# Patient Record
Sex: Male | Born: 1945 | Race: White | Hispanic: No | Marital: Married | Smoking: Former smoker
Health system: Southern US, Community
[De-identification: ages and names within clinical notes are randomized; demographics above are authoritative.]

## PROBLEM LIST (undated history)

## (undated) DIAGNOSIS — I4891 Unspecified atrial fibrillation: Secondary | ICD-10-CM

## (undated) DIAGNOSIS — I10 Essential (primary) hypertension: Secondary | ICD-10-CM

## (undated) DIAGNOSIS — A809 Acute poliomyelitis, unspecified: Secondary | ICD-10-CM

## (undated) DIAGNOSIS — J449 Chronic obstructive pulmonary disease, unspecified: Secondary | ICD-10-CM

## (undated) DIAGNOSIS — E7800 Pure hypercholesterolemia, unspecified: Secondary | ICD-10-CM

## (undated) HISTORY — DX: Unspecified atrial fibrillation: I48.91

## (undated) HISTORY — DX: Acute poliomyelitis, unspecified: A80.9

## (undated) HISTORY — PX: OTHER SURGICAL HISTORY: SHX169

---

## 2021-07-28 ENCOUNTER — Emergency Department (HOSPITAL_COMMUNITY): Payer: Medicare Other

## 2021-07-28 ENCOUNTER — Emergency Department (HOSPITAL_COMMUNITY)
Admission: EM | Admit: 2021-07-28 | Discharge: 2021-07-29 | Disposition: A | Discharge status: Home / Self Care | Payer: Medicare Other | Attending: Emergency Medicine | Admitting: Emergency Medicine

## 2021-07-28 ENCOUNTER — Encounter (HOSPITAL_COMMUNITY): Payer: Self-pay | Admitting: *Deleted

## 2021-07-28 ENCOUNTER — Other Ambulatory Visit: Payer: Self-pay

## 2021-07-28 DIAGNOSIS — Z96659 Presence of unspecified artificial knee joint: Secondary | ICD-10-CM | POA: Insufficient documentation

## 2021-07-28 DIAGNOSIS — R0602 Shortness of breath: Secondary | ICD-10-CM | POA: Diagnosis not present

## 2021-07-28 DIAGNOSIS — I10 Essential (primary) hypertension: Secondary | ICD-10-CM | POA: Insufficient documentation

## 2021-07-28 DIAGNOSIS — R0600 Dyspnea, unspecified: Secondary | ICD-10-CM

## 2021-07-28 DIAGNOSIS — R600 Localized edema: Secondary | ICD-10-CM | POA: Diagnosis not present

## 2021-07-28 DIAGNOSIS — J449 Chronic obstructive pulmonary disease, unspecified: Secondary | ICD-10-CM | POA: Diagnosis not present

## 2021-07-28 DIAGNOSIS — Z7951 Long term (current) use of inhaled steroids: Secondary | ICD-10-CM | POA: Diagnosis not present

## 2021-07-28 DIAGNOSIS — Z79899 Other long term (current) drug therapy: Secondary | ICD-10-CM | POA: Diagnosis not present

## 2021-07-28 DIAGNOSIS — Z87891 Personal history of nicotine dependence: Secondary | ICD-10-CM | POA: Insufficient documentation

## 2021-07-28 DIAGNOSIS — Z20822 Contact with and (suspected) exposure to covid-19: Secondary | ICD-10-CM | POA: Diagnosis not present

## 2021-07-28 DIAGNOSIS — R609 Edema, unspecified: Secondary | ICD-10-CM

## 2021-07-28 DIAGNOSIS — Z7901 Long term (current) use of anticoagulants: Secondary | ICD-10-CM | POA: Diagnosis not present

## 2021-07-28 HISTORY — DX: Pure hypercholesterolemia, unspecified: E78.00

## 2021-07-28 HISTORY — DX: Chronic obstructive pulmonary disease, unspecified: J44.9

## 2021-07-28 HISTORY — DX: Essential (primary) hypertension: I10

## 2021-07-28 LAB — CBC WITH DIFFERENTIAL/PLATELET
Abs Immature Granulocytes: 0.04 10*3/uL (ref 0.00–0.07)
Basophils Absolute: 0 10*3/uL (ref 0.0–0.1)
Basophils Relative: 0 %
Eosinophils Absolute: 0.2 10*3/uL (ref 0.0–0.5)
Eosinophils Relative: 2 %
HCT: 41.5 % (ref 39.0–52.0)
Hemoglobin: 13.2 g/dL (ref 13.0–17.0)
Immature Granulocytes: 1 %
Lymphocytes Relative: 11 %
Lymphs Abs: 0.9 10*3/uL (ref 0.7–4.0)
MCH: 29.5 pg (ref 26.0–34.0)
MCHC: 31.8 g/dL (ref 30.0–36.0)
MCV: 92.8 fL (ref 80.0–100.0)
Monocytes Absolute: 0.6 10*3/uL (ref 0.1–1.0)
Monocytes Relative: 7 %
Neutro Abs: 6.4 10*3/uL (ref 1.7–7.7)
Neutrophils Relative %: 79 %
Platelets: 175 10*3/uL (ref 150–400)
RBC: 4.47 MIL/uL (ref 4.22–5.81)
RDW: 16.4 % — ABNORMAL HIGH (ref 11.5–15.5)
WBC: 8 10*3/uL (ref 4.0–10.5)
nRBC: 0 % (ref 0.0–0.2)

## 2021-07-28 LAB — BASIC METABOLIC PANEL
Anion gap: 6 (ref 5–15)
BUN: 15 mg/dL (ref 8–23)
CO2: 26 mmol/L (ref 22–32)
Calcium: 9.4 mg/dL (ref 8.9–10.3)
Chloride: 105 mmol/L (ref 98–111)
Creatinine, Ser: 0.74 mg/dL (ref 0.61–1.24)
GFR, Estimated: >60 mL/min (ref >60)
Glucose, Bld: 69 mg/dL — ABNORMAL LOW (ref 70–99)
Potassium: 4.2 mmol/L (ref 3.5–5.1)
Sodium: 137 mmol/L (ref 135–145)

## 2021-07-28 LAB — TROPONIN I (HIGH SENSITIVITY)
Troponin I (High Sensitivity): 8 ng/L (ref <18)
Troponin I (High Sensitivity): 8 ng/L (ref <18)

## 2021-07-28 LAB — RESP PANEL BY RT-PCR (FLU A&B, COVID) ARPGX2
Influenza A by PCR: NEGATIVE
Influenza B by PCR: NEGATIVE
SARS Coronavirus 2 by RT PCR: NEGATIVE

## 2021-07-28 LAB — BRAIN NATRIURETIC PEPTIDE: B Natriuretic Peptide: 322 pg/mL — ABNORMAL HIGH (ref 0.0–100.0)

## 2021-07-28 MED ORDER — FUROSEMIDE 20 MG PO TABS: 20.0000 mg | ORAL_TABLET | Freq: Every day | ORAL | 0 refills | Status: DC

## 2021-07-28 NOTE — Discharge Instructions (Signed)
Take the diuretic to see if that helps with the swelling in your breathing.  Follow-up with your doctor to be rechecked.  Return to the ED as needed for recurrent or worsening symptoms

## 2021-07-28 NOTE — ED Provider Notes (Signed)
[facility] EMERGENCY DEPARTMENT Provider Note   CSN: [id number] Arrival date & time: 07/28/21  1512     History Chief Complaint  Patient presents with   Shortness of Breath    Noelle Hoogland is a 75 y.o. male.   Shortness of Breath  Patient presented to the ED for evaluation of shortness of breath.  Patient states he has had the symptoms for the last couple of weeks.  He has noticed that when he exerts himself he starts to get more short of breath.  He has not had any fevers or chills.  He has not had any chest pain.  He did have a change in his medications about a week ago.  He was on a combination blood pressure medication that included a diuretic.  He was taken off the diuretic portion prior to the symptoms starting.  This morning his daughter noticed that his tongue seemed dry and his speech seemed a little bit off.  She convinced him to come to the ED.  Patient states he was not feeling any tickly worse today.  He is not having any trouble with his speech now.  He denies any focal numbness or weakness.  No headache.  Past Medical History:  Diagnosis Date   COPD (chronic obstructive pulmonary disease) (HCC)    High cholesterol    Hypertension     There are no problems to display for this patient.   Past Surgical History:  Procedure Laterality Date   right knee replacement     rotator cuff surgery Left        History reviewed. No pertinent family history.  Social History   Tobacco Use   Smoking status: Former    Types: Cigarettes   Smokeless tobacco: Never  Substance Use Topics   Alcohol use: Yes    Comment: occ. use   Drug use: Never    Home Medications Prior to Admission medications   Medication Sig Start Date End Date Taking? Authorizing Provider  albuterol (VENTOLIN HFA) 108 (90 Base) MCG/ACT inhaler Inhale into the lungs.   Yes [provider]  Cholecalciferol 50 MCG (2000 UT) CAPS Take 1 capsule by mouth daily.   Yes [provider]  citalopram (CELEXA) 20 MG tablet Take 20 mg by mouth daily. 04/24/12  Yes [provider]  docusate sodium (COLACE) 100 MG capsule Take 100 mg by mouth daily.   Yes [provider]  fluticasone (FLONASE) 50 MCG/ACT nasal spray Place 2 sprays into both nostrils daily as needed for allergies. 05/02/12  Yes [provider]  furosemide (LASIX) 20 MG tablet Take 1 tablet (20 mg total) by mouth daily for 5 days. 07/28/21 08/02/21 Yes Linwood Dibbles, MD  glimepiride (AMARYL) 2 MG tablet Take 2 mg by mouth daily with breakfast.   Yes [provider]  losartan (COZAAR) 100 MG tablet Take 100 mg by mouth daily. 07/07/21  Yes [provider]  metoprolol tartrate (LOPRESSOR) 100 MG tablet Take 100 mg by mouth 2 (two) times daily.   Yes [provider]  Misc Natural Products (NEURIVA PO) Take 1 tablet by mouth daily.   Yes [provider]  mupirocin ointment (BACTROBAN) 2 % Apply topically daily. 03/26/21  Yes [provider]  oxybutynin (DITROPAN-XL) 5 MG 24 hr tablet Take 5 mg by mouth at bedtime.   Yes [provider]  pantoprazole (PROTONIX) 40 MG tablet Take 40 mg by mouth daily. 08/06/14  Yes [provider]  rivaroxaban (  XARELTO) 20 MG TABS tablet Take 20 mg by mouth daily.   Yes [provider]  rosuvastatin (CRESTOR) 10 MG tablet Take 10 mg by mouth daily.   Yes [provider]  SYMBICORT 160-4.5 MCG/ACT inhaler Inhale 2 puffs into the lungs 2 (two) times daily. 02/26/21  Yes [provider]  levofloxacin (LEVAQUIN) 500 MG tablet levofloxacin 500 mg tablet  Take 1 tablet every day by oral route. Patient not taking: Reported on 07/28/2021    [provider]    Allergies    Patient has no known allergies.  Review of Systems   Review of Systems  Respiratory:  Positive for shortness of breath.   All other systems reviewed and are negative.  Physical Exam Updated  Vital Signs BP 137/82   Pulse (!) 29   Temp 98.2 F (36.8 C) (Oral)   Resp (!) 22   Ht 1.702 m (5\' 7" )   Wt 106.6 kg   SpO2 97%   BMI 36.81 kg/m   Physical Exam Vitals and nursing note reviewed.  Constitutional:      General: He is not in acute distress. HENT:     Head: Normocephalic and atraumatic.     Right Ear: External ear normal.     Left Ear: External ear normal.  Eyes:     General: No scleral icterus.       Right eye: No discharge.        Left eye: No discharge.     Conjunctiva/sclera: Conjunctivae normal.  Neck:     Trachea: No tracheal deviation.  Cardiovascular:     Rate and Rhythm: Normal rate and regular rhythm.  Pulmonary:     Effort: Pulmonary effort is normal. No respiratory distress.     Breath sounds: No stridor. Rales present. No wheezing.  Abdominal:     General: Bowel sounds are normal. There is no distension.     Palpations: Abdomen is soft.     Tenderness: There is no abdominal tenderness. There is no guarding or rebound.  Musculoskeletal:        General: No tenderness or deformity.     Cervical back: Neck supple.     Right lower leg: Edema present.     Left lower leg: Edema present.     Comments: Atrophy left lower extremity, patient is status post polio  Skin:    General: Skin is warm and dry.     Findings: No rash.  Neurological:     General: No focal deficit present.     Mental Status: He is alert.     Cranial Nerves: No cranial nerve deficit (no facial droop, extraocular movements intact, no slurred speech).     Sensory: No sensory deficit.     Motor: No abnormal muscle tone or seizure activity.     Coordination: Coordination normal.     Comments: Weakness left lower extremity  Psychiatric:        Mood and Affect: Mood normal.    ED Results / Procedures / Treatments   Labs (all labs ordered are listed, but only abnormal results are displayed) Labs Reviewed  CBC WITH DIFFERENTIAL/PLATELET - Abnormal; Notable for the following  components:      Result Value   RDW 16.4 (*)    All other components within normal limits  BASIC METABOLIC PANEL - Abnormal; Notable for the following components:   Glucose, Bld 69 (*)    All other components within normal limits  BRAIN NATRIURETIC PEPTIDE -  Abnormal; Notable for the following components:   B Natriuretic Peptide 322.0 (*)    All other components within normal limits  RESP PANEL BY RT-PCR (FLU A&B, COVID) ARPGX2  TROPONIN I (HIGH SENSITIVITY)  TROPONIN I (HIGH SENSITIVITY)    EKG None  Radiology DG Chest 2 View  Result Date: 07/28/2021 CLINICAL DATA:  Shortness of breath. EXAM: CHEST - 2 VIEW COMPARISON:  None. FINDINGS: The heart is upper limits of normal in size given the AP projection. The mediastinal and hilar contours are within normal limits. There is mild central vascular congestion but no overt pulmonary edema. Small bilateral pleural effusions and bibasilar atelectasis noted. No worrisome pulmonary lesions. The bony thorax is intact. IMPRESSION: Vascular congestion, small bilateral pleural effusions and bibasilar atelectasis. Electronically Signed   By: Rudie Meyer M.D.   On: 07/28/2021 16:39    Procedures Procedures   Medications Ordered in ED Medications - No data to display  ED Course  I have reviewed the triage vital signs and the nursing notes.  Pertinent labs & imaging results that were available during my care of the patient were reviewed by me and considered in my medical decision making (see chart for details).  Clinical Course as of 07/28/21 2231  Tue Jul 28, 2021  2130 BNP is slightly increased at 322.  Troponin is normal.  COVID and flu are negative [JK]  2130 Chest x-ray shows vascular congestion [JK]    Clinical Course User Index [JK] Linwood Dibbles, MD   MDM Rules/Calculators/A&P                           Patient presented to the ER for evaluation of shortness of breath.  Patient's symptoms have been ongoing for couple weeks.   Patient did not feel that he gotten much worse but his daughter was concerned and wanted him to get checked out.  Patient does not have any pneumonia on x-ray.  Chest x-ray does show some vascular congestion.  BNP is slightly elevated.  No findings to suggest cardiac ischemia.  COVID and flu are negative.  Troponin is normal.  Patient has been on anticholinergic medications that could be affecting the dry mouth sensation.  He stopped taking his diuretic medication 1 week ago and this could be contributing to his symptoms.  We will have him try a short course of Lasix considering the peripheral edema and the vascular congestion.  We will have him closely follow-up with his primary care doctor to be rechecked.  At this time I do not feel that patient requires hospitalization he is comfortable with this plan. Final Clinical Impression(s) / ED Diagnoses Final diagnoses:  Dyspnea, unspecified type  Peripheral edema    Rx / DC Orders ED Discharge Orders          Ordered    furosemide (LASIX) 20 MG tablet  Daily        07/28/21 2229             Linwood Dibbles, MD 07/28/21 2231

## 2021-07-28 NOTE — ED Triage Notes (Addendum)
Pt with SOB with exertion for a week and gotten worse.  Denies SOB at rest.  Mouth with sores and tongue dry and thick.  Daughter in triage states pt with yeast under his armpits as well.

## 2021-11-06 ENCOUNTER — Other Ambulatory Visit (INDEPENDENT_AMBULATORY_CARE_PROVIDER_SITE_OTHER): Payer: Medicare Other

## 2021-11-06 ENCOUNTER — Ambulatory Visit (INDEPENDENT_AMBULATORY_CARE_PROVIDER_SITE_OTHER): Payer: Medicare Other | Admitting: Physician Assistant

## 2021-11-06 ENCOUNTER — Encounter: Payer: Self-pay | Admitting: Physician Assistant

## 2021-11-06 VITALS — BP 90/60 | HR 84 | Ht 67.0 in | Wt 217.4 lb

## 2021-11-06 DIAGNOSIS — R7989 Other specified abnormal findings of blood chemistry: Secondary | ICD-10-CM

## 2021-11-06 DIAGNOSIS — E119 Type 2 diabetes mellitus without complications: Secondary | ICD-10-CM | POA: Insufficient documentation

## 2021-11-06 DIAGNOSIS — R5383 Other fatigue: Secondary | ICD-10-CM

## 2021-11-06 DIAGNOSIS — K7460 Unspecified cirrhosis of liver: Secondary | ICD-10-CM

## 2021-11-06 DIAGNOSIS — R634 Abnormal weight loss: Secondary | ICD-10-CM | POA: Diagnosis not present

## 2021-11-06 DIAGNOSIS — I10 Essential (primary) hypertension: Secondary | ICD-10-CM

## 2021-11-06 DIAGNOSIS — J449 Chronic obstructive pulmonary disease, unspecified: Secondary | ICD-10-CM | POA: Insufficient documentation

## 2021-11-06 DIAGNOSIS — G4733 Obstructive sleep apnea (adult) (pediatric): Secondary | ICD-10-CM

## 2021-11-06 DIAGNOSIS — Z8612 Personal history of poliomyelitis: Secondary | ICD-10-CM

## 2021-11-06 DIAGNOSIS — E785 Hyperlipidemia, unspecified: Secondary | ICD-10-CM | POA: Insufficient documentation

## 2021-11-06 DIAGNOSIS — I4891 Unspecified atrial fibrillation: Secondary | ICD-10-CM

## 2021-11-06 DIAGNOSIS — Z9989 Dependence on other enabling machines and devices: Secondary | ICD-10-CM

## 2021-11-06 DIAGNOSIS — D649 Anemia, unspecified: Secondary | ICD-10-CM

## 2021-11-06 DIAGNOSIS — J439 Emphysema, unspecified: Secondary | ICD-10-CM

## 2021-11-06 LAB — HEPATIC FUNCTION PANEL
ALT: 15 U/L (ref 0–53)
AST: 19 U/L (ref 0–37)
Albumin: 3.4 g/dL — ABNORMAL LOW (ref 3.5–5.2)
Alkaline Phosphatase: 96 U/L (ref 39–117)
Bilirubin, Direct: 0.2 mg/dL (ref 0.0–0.3)
Total Bilirubin: 0.7 mg/dL (ref 0.2–1.2)
Total Protein: 7.1 g/dL (ref 6.0–8.3)

## 2021-11-06 LAB — SEDIMENTATION RATE: Sed Rate: 52 mm/hr — ABNORMAL HIGH (ref 0–20)

## 2021-11-06 LAB — FERRITIN: Ferritin: 35.3 ng/mL (ref 22.0–322.0)

## 2021-11-06 LAB — PROTIME-INR
INR: 2.7 ratio — ABNORMAL HIGH (ref 0.8–1.0)
Prothrombin Time: 28 s — ABNORMAL HIGH (ref 9.6–13.1)

## 2021-11-06 NOTE — Patient Instructions (Addendum)
If you are age 77 or older, your body mass index should be between 23-30. Your Body mass index is 34.05 kg/m?Marland Kitchen If this is out of the aforementioned range listed, please consider follow up with your Primary Care Provider. ?________________________________________________________ ? ?The [facility] providers would like to encourage you to use [facility] to communicate with providers for non-urgent requests or questions.  Due to long hold times on the telephone, sending your provider a message by [facility] may be a faster and more efficient way to get a response.  Please allow 48 business hours for a response.  Please remember that this is for non-urgent requests.  ?_______________________________________________________ ? ?Your provider has requested that you go to the basement level for lab work before leaving today. Press "B" on the elevator. The lab is located at the first door on the left as you exit the elevator. ? ?You have been scheduled for an MRI at [facility] on 11/17/2021. Your appointment time is 3:00 pm. Please arrive to admitting (at main entrance of the hospital) 30 minutes prior to your appointment time for registration purposes. Please make certain not to have anything to eat or drink 6 hours prior to your test. In addition, if you have any metal in your body, have a pacemaker or defibrillator, please be sure to let your ordering physician know. This test typically takes 45 minutes to 1 hour to complete. Should you need to reschedule, please call [phone] to do so. ? ?START Miralax 1 capful daily in 8 ounces of water daily or juice. ? ?You have been scheduled to follow up with Dr Orvan Falconer on December 02, 2021 at 2:30 pm. ? ?Thank you for entrusting me with your care and choosing [facility]. ? ?Amy Esterwood, PA-C ? ?

## 2021-11-06 NOTE — Progress Notes (Signed)
Subjective:    Patient ID: Alexander Johnson, male    DOB: July 10, 1946, 76 y.o.   MRN: [id number]  HPI Alexander Johnson is a pleasant 76 year old white male, new to GI today referred by his PCP Dr. Derrel Johnson [place] for evaluation of weight loss. Patient had undergone prior colonoscopies remotely per Dr. Chandra Johnson, he believes last one was around 10 years ago, no polyps.  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  His current illness started in November 2022 with generalized fatigue and weakness.  His daughter says that she took him to the emergency room on a day where his mental status seemed altered, speech slurred and he had some drawing of his mouth.  She was afraid that he was having a CVA.  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  Daughter says that symptoms resolved within about 24 hours other than that he had some continued drawing of his mouth for some days after that.  Ever since then he has had persistent severe fatigue, and weakness and has lost about 25 pounds.  He says that he had absolutely no appetite for a couple of months and just was not eating very much.  Apparently over the past week or so his appetite has improved, he is eating better and feeling somewhat better in general. He denies any abdominal pain or discomfort, never had any nausea or vomiting, no fever or chills, no changes in bowel habits though he does have problems with mild chronic constipation.  He occasionally notes some blood on the tissue which she attributes to external hemorrhoids. No complaints of dysphagia or  odynophagia, no heartburn or indigestion.  He has had some work-up since that time with CT of the abdomen and pelvis which was done noncontrasted on 11/02/2021 this shows a nodular cirrhotic appearing liver, normal-appearing pancreas and gallbladder normal spleen, no retroperitoneal or pelvic adenopathy, no ascites, large amount of stool in the colon and diverticulosis noted.  Exam limited by lack of contrast  They have also brought some of his most recent labs-February 2023 TSH 5.0 LFTs normal with exception of an alk phos at 140 WBC 11.3/hemoglobin 14/platelets 324 CRP evaded at 11, sed rate greater than 120 Calcium 10.6  Patient is not aware of any prior diagnosis of liver disease or cirrhosis.  He had been a social drinker in the past, no family history of liver disease that they are aware of other than patient's father who also had abused alcohol.  Patient has no history of hepatitis that he is aware of.  Though feeling a bit better he has been more weak in general, comes in in a wheelchair today due to weakness.  But no specific myalgias or arthralgias other than his usual arthritic symptoms.  Review of Systems Pertinent positive and negative review of systems were noted in the above HPI section.  All other review of systems was otherwise negative.   Outpatient Encounter Medications as of 11/06/2021  Medication Sig   albuterol (VENTOLIN HFA) 108 (90 Base) MCG/ACT inhaler Inhale into the lungs.   Cholecalciferol 50 MCG (2000 UT) CAPS Take 1 capsule by mouth daily.   citalopram (CELEXA) 20 MG tablet Take  20 mg by mouth daily.   docusate sodium (COLACE) 100 MG capsule Take 100 mg by mouth daily.   fluticasone (FLONASE) 50 MCG/ACT nasal spray Place 2 sprays into both nostrils daily as needed for allergies.   glimepiride (AMARYL) 2 MG tablet Take 2 mg by mouth daily with breakfast.   levofloxacin (LEVAQUIN) 500 MG tablet levofloxacin 500 mg tablet  Take 1 tablet every day by oral route.  (Patient not taking: Reported on 07/28/2021)   losartan (COZAAR) 100 MG tablet Take 100 mg by mouth daily.   metoprolol tartrate (LOPRESSOR) 100 MG tablet Take 100 mg by mouth 2 (two) times daily.   mirabegron ER (MYRBETRIQ) 50 MG TB24 tablet Myrbetriq 50 mg tablet,extended release  Take 1 tablet every day by oral route.   Misc Natural Products (NEURIVA PO) Take 1 tablet by mouth daily.   mupirocin ointment (BACTROBAN) 2 % Apply topically daily.   pantoprazole (PROTONIX) 40 MG tablet Take 40 mg by mouth daily.   rivaroxaban (XARELTO) 20 MG TABS tablet Take 20 mg by mouth daily.   rosuvastatin (CRESTOR) 10 MG tablet Take 10 mg by mouth daily.   SYMBICORT 160-4.5 MCG/ACT inhaler Inhale 2 puffs into the lungs 2 (two) times daily.   [DISCONTINUED] furosemide (LASIX) 20 MG tablet Take 1 tablet (20 mg total) by mouth daily for 5 days.   [DISCONTINUED] oxybutynin (DITROPAN-XL) 5 MG 24 hr tablet Take 5 mg by mouth at bedtime.   No facility-administered encounter medications on file as of 11/06/2021.   Allergies  Allergen Reactions   Metformin Diarrhea   Patient Active Problem List   Diagnosis Date Noted   Atrial fibrillation (Alexander Johnson) 11/06/2021   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   HTN (hypertension) 11/06/2021   COPD (chronic obstructive pulmonary disease) ([place]) 11/06/2021   OSA on CPAP 11/06/2021   DM (diabetes mellitus) ([place]) 11/06/2021   Hyperlipidemia 11/06/2021   Hepatic cirrhosis ([place]) 11/06/2021   Social History   Socioeconomic History   Marital status: Married    Spouse name: Not on file   Number of children: Not on file   Years of education: Not on file   Highest education level: Not on file  Occupational History   Not on file  Tobacco Use   Smoking status: Former    Types: Cigarettes   Smokeless tobacco: Never  Vaping Use   Vaping Use: Never used  Substance and Sexual Activity   Alcohol use: Not Currently    Comment: occ. use   Drug use: Never   Sexual activity:  Not on file  Other Topics Concern   Not on file  Social History Narrative   Not on file   Social Determinants of Health   Financial Resource Strain: Not on file  Food Insecurity: Not on file  Transportation Needs: Not on file  Physical Activity: Not on file  Stress: Not on file  Social Connections: Not on file  Intimate Partner Violence: Not on file    Mr. Sluka family history includes Dementia in his mother; Heart attack in his father; Hypertension in his father and mother.      Objective:    Vitals:   11/06/21 1514  BP: 90/60  Pulse: 84    Physical Exam. Well-developed well-nourished elderly white male in no acute distress.  Patient is in a wheelchair, brace on left lower extremity, accompanied by his daughter Weight, 217 BMI 34.0  HEENT; nontraumatic normocephalic, EOMI, PE R LA, sclera anicteric. Oropharynx; not examined today Neck;  supple, no JVD Cardiovascular; regular rate and rhythm with S1-S2, no murmur rub or gallop Pulmonary; few scattered rhonchi, somewhat decreased breath sounds bilaterally Abdomen; soft, obese nontender, nondistended, no palpable mass or hepatosplenomegaly, bowel sounds are active Rectal; not done today Skin; patient has some scattered somewhat ulcerated lesions, one small patch on the abdomen and present in the bilateral lower extremities with surrounding erythema Extremities; no clubbing cyanosis or edema skin warm and dry Neuro/Psych; alert and oriented x4, grossly nonfocal mood and affect appropriate        Assessment & Plan:   #5 76 year old white male with multiple comorbidities, with onset of significant weakness and fatigue November 2022 which has persisted, associated anorexia and weight loss of about 25 pounds. Interestingly at onset of symptoms he also had an episode of altered mental status, slurred speech and "drawing of the mouth".  Noncontrasted head CT did not show any acute CVA, did show some old lacunar infarct and  white matter disease with global atrophy. Unclear whether he may have had a TIA.  Work-up thus far with noncontrasted CT of the abdomen and pelvis revealing for cirrhosis, and diverticulosis.  Pertinent labs with hypercalcemia 10.6, significantly elevated CRP and sed rate greater than 120.  Underlying etiology of above constellation of symptoms is not clear.  With significantly elevated sed rate greater than 100 need to consider PMR (polymyalgia rheumatica), underlying malignancy.  #2 postpolio syndrome 3.  History of atrial fibrillation on Xarelto 4.  Hyperlipidemia 5.  Hypertension 6.  Adult onset diabetes mellitus 7.  COPD no oxygen use 8.  Sleep apnea with CPAP use 9.  Degenerative disc disease  Plan;\ With new finding of cirrhosis on a noncontrasted CT need further hepatic imaging rule out HCC, rule out other intra-abdominal malignancy.  Scheduled for MRI of the abdomen/pelvis  In patient with COPD, and weight loss also needs chest CT.  We will communicate with his PCP to see if he can get the chest CT done in [place]  We will repeat sed rate, check AFP/INR, hepatic panel to include GGT/5 prime nucleotidase We will check viral hepatitis serologies, autoimmune hepatic markers and inheritable markers to include ferritin.  Patient is not a great candidate for endoscopic evaluation with his multiple comorbidities and underlying COPD.  If above work-up is unrevealing, may need to consider EGD and colonoscopy.  Patient encouraged to eat as his appetite has improved, with goal to maintain his weight  Patient will be established with Dr. Tarri Glenn.  We will arrange follow-up pending results of above.     Saphia Vanderford Genia Harold PA-C 11/06/2021   Cc: Pomposini, Cherly Anderson, MD

## 2021-11-07 LAB — ANA: Anti Nuclear Antibody (ANA): NEGATIVE

## 2021-11-09 LAB — HEPATITIS C ANTIBODY
Hepatitis C Ab: NONREACTIVE
SIGNAL TO CUT-OFF: 0.16 (ref <1.00)

## 2021-11-09 LAB — CEA: CEA: <2 ng/mL

## 2021-11-09 LAB — HEPATITIS B SURFACE ANTIGEN: Hepatitis B Surface Ag: NONREACTIVE

## 2021-11-09 LAB — HEPATITIS B SURFACE ANTIBODY,QUALITATIVE: Hep B S Ab: NONREACTIVE

## 2021-11-09 LAB — AFP TUMOR MARKER: AFP-Tumor Marker: 6.4 ng/mL — ABNORMAL HIGH (ref <6.1)

## 2021-11-09 LAB — MITOCHONDRIAL ANTIBODIES: Mitochondrial M2 Ab, IgG: <=20 U (ref <=20.0)

## 2021-11-09 LAB — ANTI-SMOOTH MUSCLE ANTIBODY, IGG: Actin (Smooth Muscle) Antibody (IGG): <20 U (ref <20)

## 2021-11-09 LAB — CERULOPLASMIN: Ceruloplasmin: 33 mg/dL (ref 18–36)

## 2021-11-09 NOTE — Progress Notes (Signed)
Reviewed and agree with management plans. ? ?Marigene Erler L. Lestine Rahe, MD, MPH  ?

## 2021-11-17 ENCOUNTER — Ambulatory Visit (HOSPITAL_COMMUNITY)
Admission: RE | Admit: 2021-11-17 | Discharge: 2021-11-17 | Disposition: A | Discharge status: Home / Self Care | Payer: Medicare Other | Source: Ambulatory Visit | Attending: Physician Assistant | Admitting: Physician Assistant

## 2021-11-17 ENCOUNTER — Other Ambulatory Visit: Payer: Self-pay

## 2021-11-17 DIAGNOSIS — R5383 Other fatigue: Secondary | ICD-10-CM | POA: Diagnosis present

## 2021-11-17 DIAGNOSIS — R634 Abnormal weight loss: Secondary | ICD-10-CM | POA: Diagnosis present

## 2021-11-17 DIAGNOSIS — K7460 Unspecified cirrhosis of liver: Secondary | ICD-10-CM | POA: Insufficient documentation

## 2021-11-17 DIAGNOSIS — D649 Anemia, unspecified: Secondary | ICD-10-CM | POA: Diagnosis present

## 2021-11-17 IMAGING — MR MR PELVIS WO/W CM
7 of 10 series · 33 of 48 positions shown · IV contrast (gadavist)
Comparison: None.

CLINICAL DATA: Weight loss

EXAM:
MRI ABDOMEN AND PELVIS WITHOUT AND WITH CONTRAST
TECHNIQUE: Multiplanar multisequence MR imaging of the abdomen and pelvis was
performed both before and after the administration of intravenous
contrast.
CONTRAST:  10mL GADAVIST GADOBUTROL 1 MMOL/ML IV SOLN

[Series 4: T2 · coronal · 7.0mm · 1.72mm/px · 5 of 30 slices shown (1 of 3)]
[im 1/30]
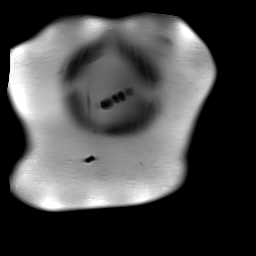
[im 8/30]
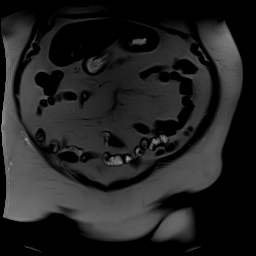
[im 15/30]
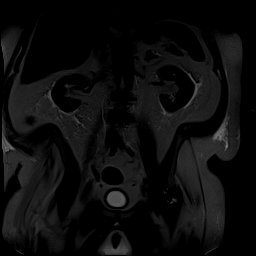
[im 22/30]
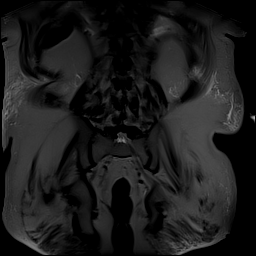
[im 30/30]
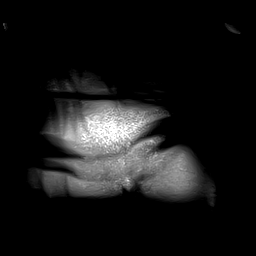

[Series 5: T2 · axial · 6.0mm · 0.78mm/px · z∈[-382,-145]mm · 4 of 34 slices shown (2 of 3)]
[im 1/34]
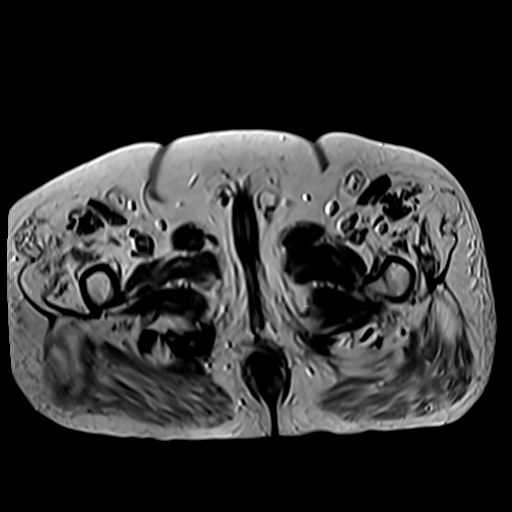
[im 12/34]
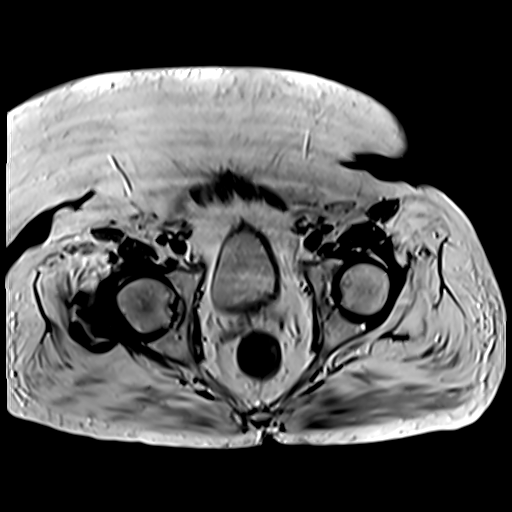
[im 23/34]
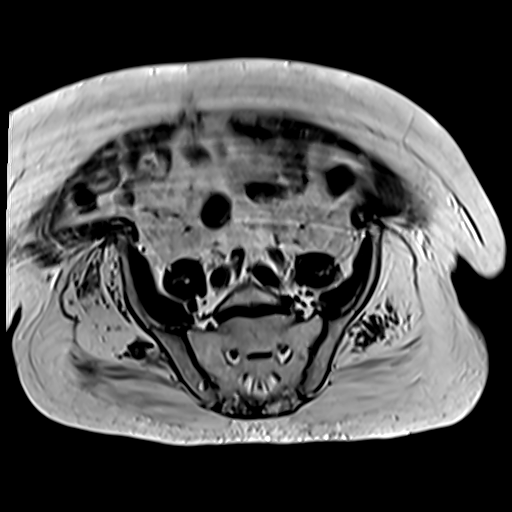
[im 34/34]
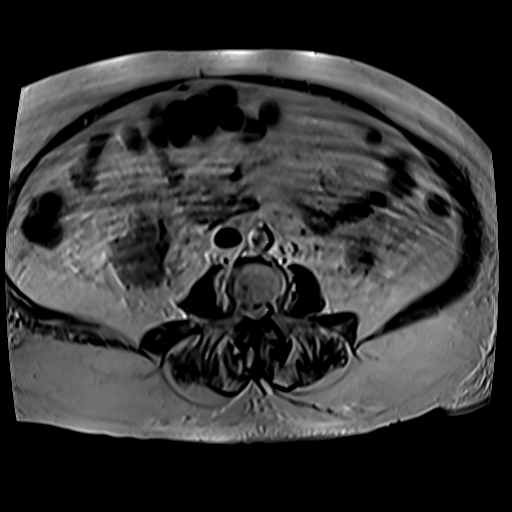

[Series 6: T2 fat-sat · axial · 6.0mm · 0.78mm/px · z∈[-382,-145]mm · 4 of 34 slices shown]
[im 1/34]
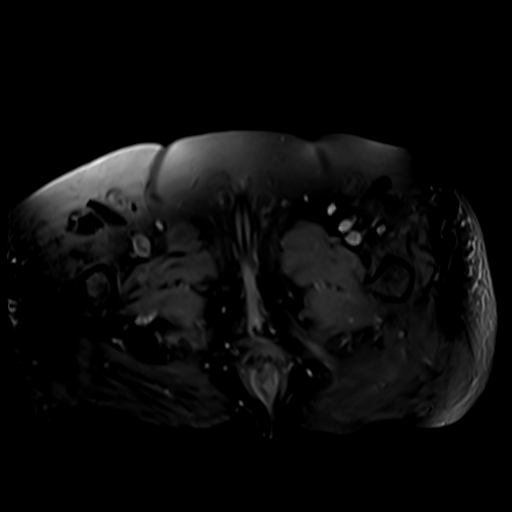
[im 12/34]
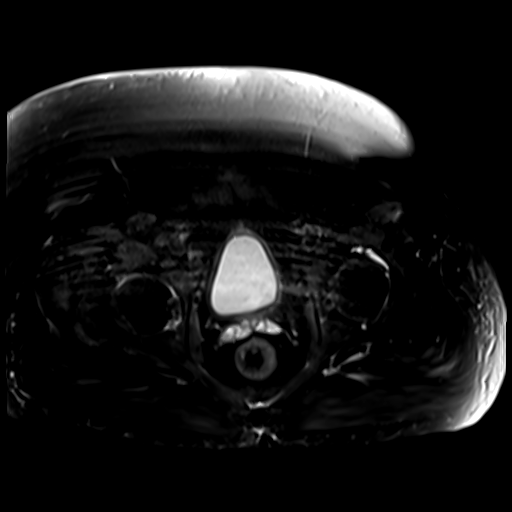
[im 23/34]
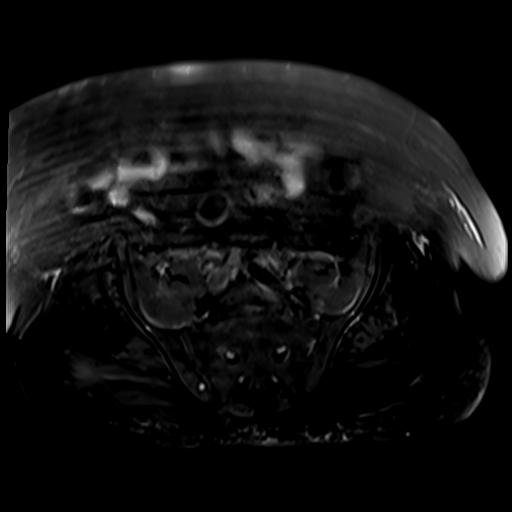
[im 34/34]
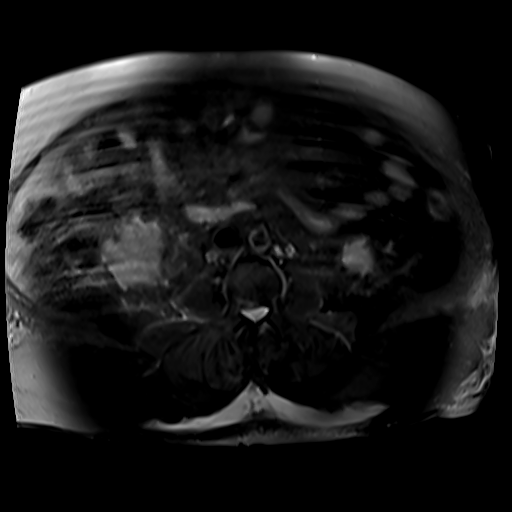

[Series 7: T2 · sagittal · 5.0mm · 0.66mm/px · 5 of 40 slices shown (3 of 3)]
[im 1/40]
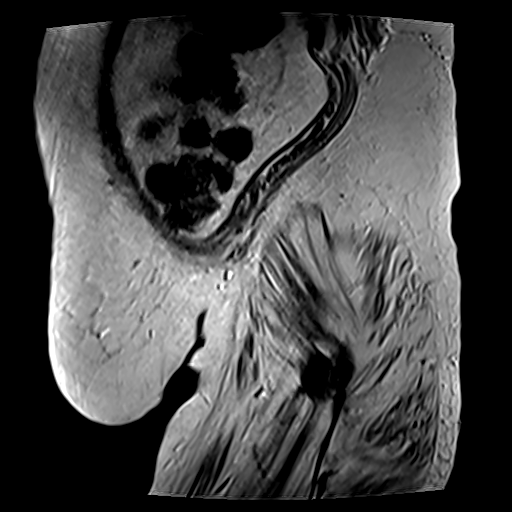
[im 10/40]
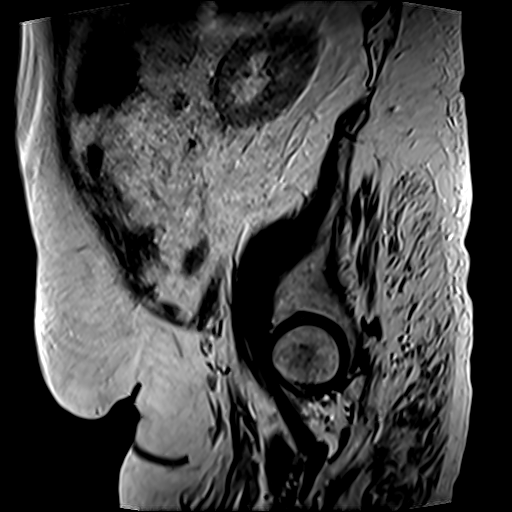
[im 20/40]
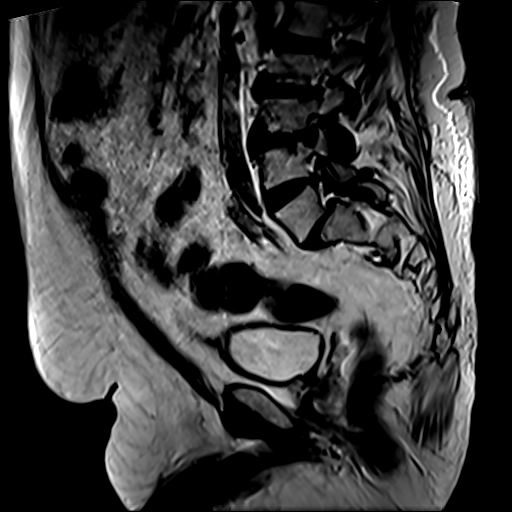
[im 30/40]
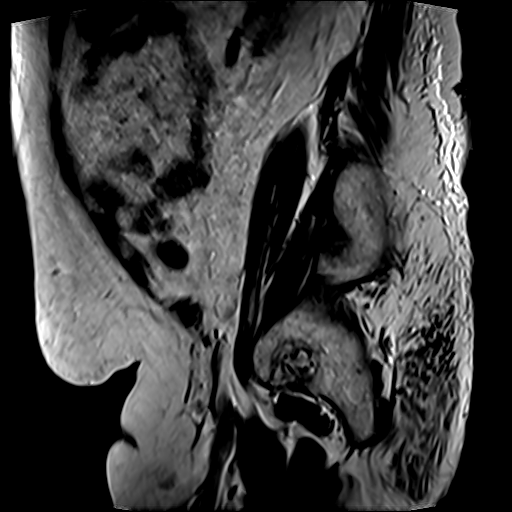
[im 40/40]
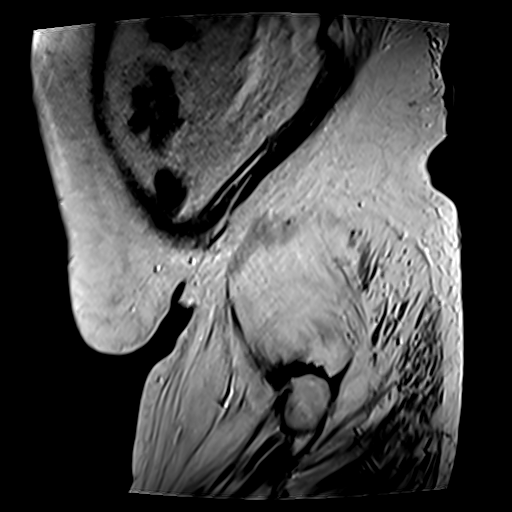

[Series 8: T1 · axial · 6.0mm · 1.25mm/px · z∈[-382,-145]mm · 4 of 34 slices shown]
[im 1/34]
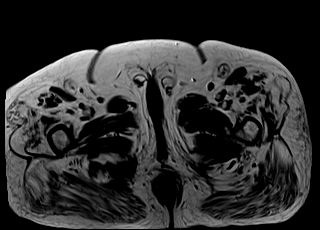
[im 12/34]
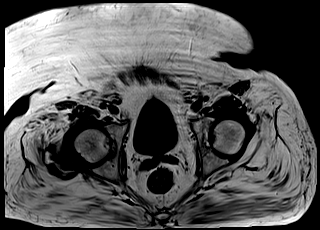
[im 23/34]
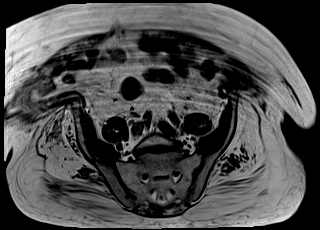
[im 34/34]
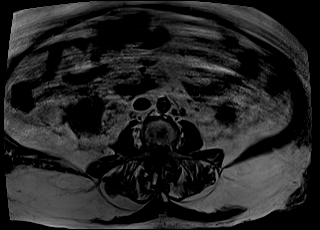

[Series 9: T1 fat-sat · axial · non-contrast · 6.0mm · 1.25mm/px · z∈[-382,-145]mm · 4 of 34 slices shown]
[im 1/34]
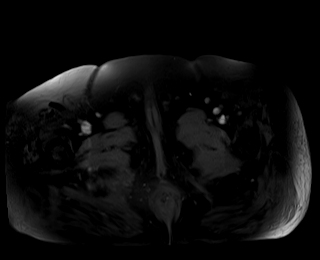
[im 12/34]
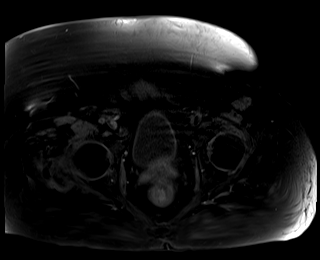
[im 23/34]
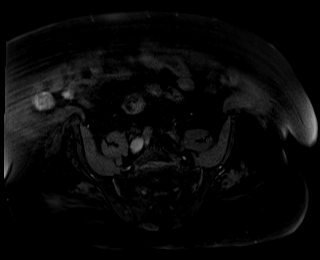
[im 34/34]
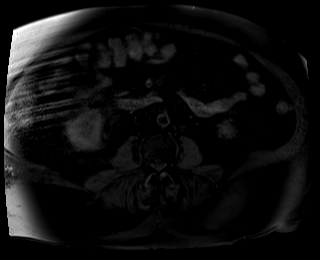

[Series 10: DWI · axial · 6.0mm · 1.56mm/px · z∈[-389,-165]mm · 7 of 72 slices shown]
[im 1/72]
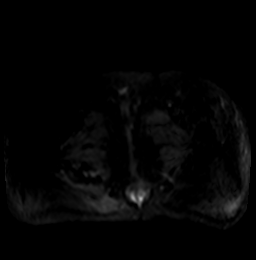
[im 9/72]
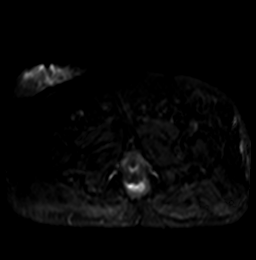
[im 18/72]
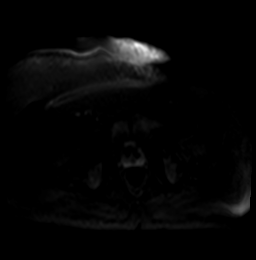
[im 27/72]
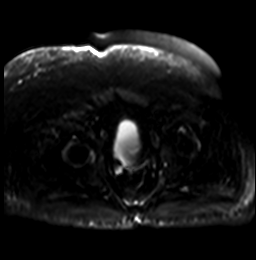
[im 45/72]
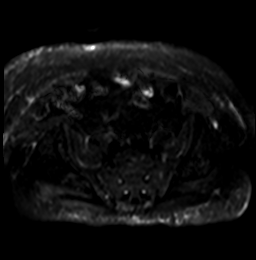
[im 54/72]
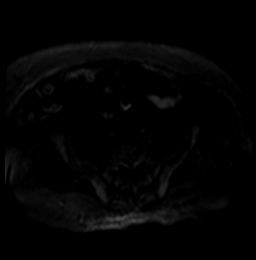
[im 63/72]
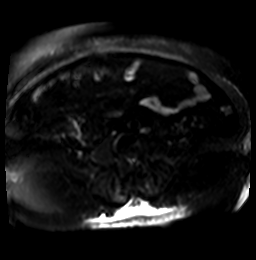

[33 of 48 positions shown; findings below may reference images not displayed]

FINDINGS: Somewhat limited due to motion.

COMBINED FINDINGS FOR BOTH MR ABDOMEN AND PELVIS

Lower chest: No acute findings.

Hepatobiliary: Liver is normal in size and contour with no
suspicious mass identified. Gallbladder appears normal. No biliary
ductal dilatation.

Pancreas: Mildly atrophic with no mass or ductal dilatation
identified.

Spleen:  Within normal limits in size and appearance.

Adrenals/Urinary Tract: No masses identified. No evidence of
hydronephrosis. No urinary bladder mass identified.

Stomach/Bowel: No evidence of bowel obstruction. No bowel wall edema
or abnormal enhancement identified.

Vascular/Lymphatic: No pathologically enlarged lymph nodes
identified. No abdominal aortic aneurysm demonstrated.

Reproductive: Prostate gland normal size.

Other:  No ascites.

Musculoskeletal: No suspicious bony lesions identified. Note is made
of overall atrophy of the musculature.
IMPRESSION: No acute process or suspicious mass identified in the abdomen or
pelvis.

## 2021-11-17 IMAGING — MR MR ABDOMEN WO/W CM
18 series · 48 of 48 positions shown · IV contrast (GADAVIST)
Comparison: None.

CLINICAL DATA: Weight loss

EXAM:
MRI ABDOMEN AND PELVIS WITHOUT AND WITH CONTRAST
TECHNIQUE: Multiplanar multisequence MR imaging of the abdomen and pelvis was
performed both before and after the administration of intravenous
contrast.
CONTRAST:  10mL GADAVIST GADOBUTROL 1 MMOL/ML IV SOLN

[Series 5: T2 · coronal · 7.0mm · 1.72mm/px · 2 of 30 slices shown (1 of 2)]
[im 1/30]
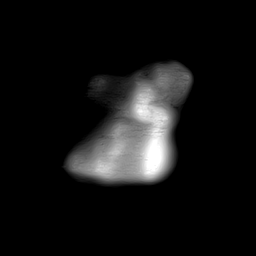
[im 30/30]
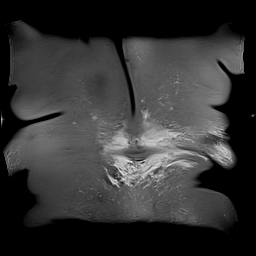

[Series 6: T2 fat-sat · axial · 5.5mm · 1.72mm/px · z∈[-274,+42]mm · 3 of 42 slices shown]
[im 1/42]
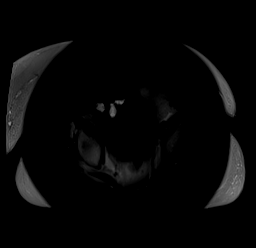
[im 21/42]
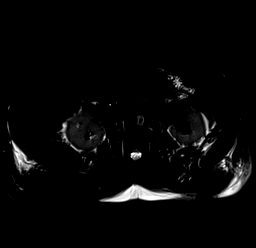
[im 42/42]
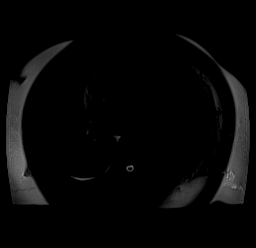

[Series 7: T1 · axial · 4.0mm · 1.38mm/px · z∈[-237,+47]mm · 3 of 72 slices shown (1 of 2)]
[im 1/72]
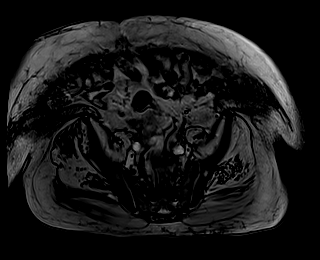
[im 36/72]
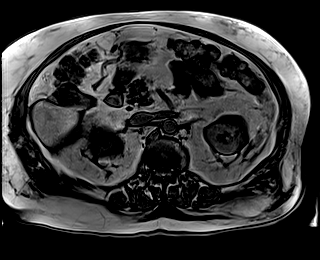
[im 72/72]
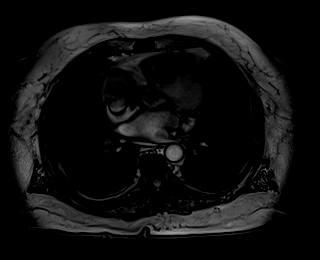

[Series 8: T1 · axial · 4.0mm · 1.38mm/px · z∈[-237,+47]mm · 3 of 72 slices shown (2 of 2)]
[im 1/72]
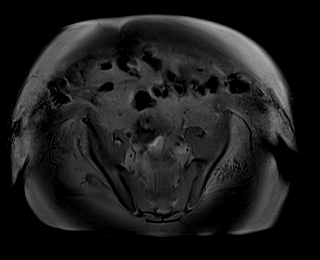
[im 36/72]
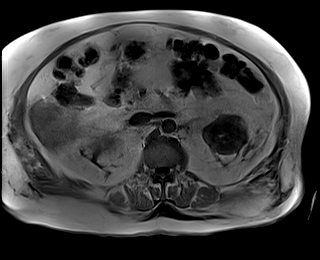
[im 72/72]
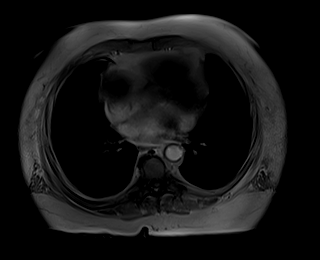

[Series 9: DWI · axial · 6.0mm · 1.72mm/px · z∈[-239,+55]mm · 3 of 72 slices shown (1 of 2)]
[im 1/72]
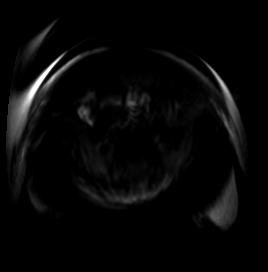
[im 36/72]
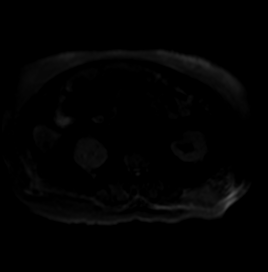
[im 72/72]
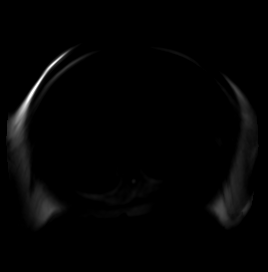

[Series 10: DWI · axial · 6.0mm · 1.72mm/px · 1 of 36 slices shown (2 of 2)]
[im 1/36]
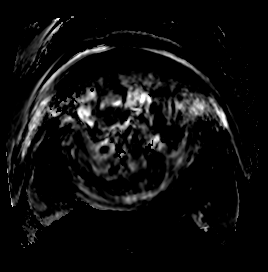

[Series 11: bSSFP · axial · 6.0mm · 0.86mm/px · z∈[-222,+42]mm · 2 of 45 slices shown]
[im 1/45]
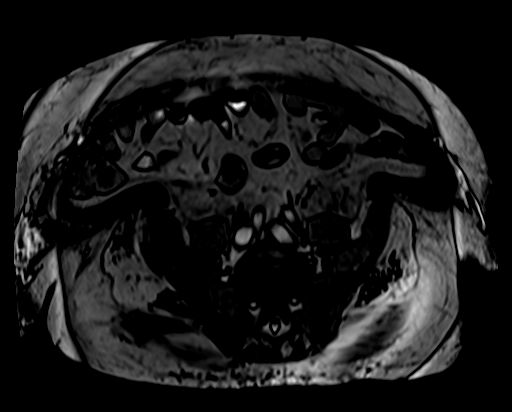
[im 45/45]
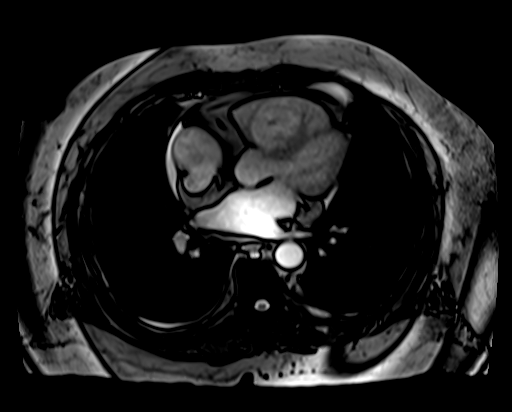

[Series 13: T1 dynamic · axial · 3.0mm · 1.38mm/px · z∈[-231,+30]mm · 3 of 88 slices shown (1 of 10)]
[im 1/88]
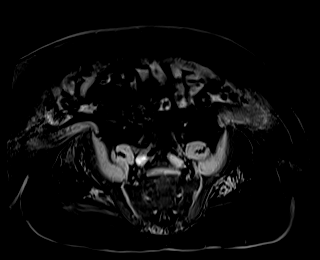
[im 44/88]
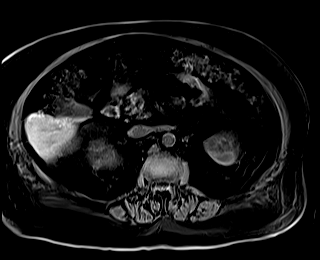
[im 88/88]
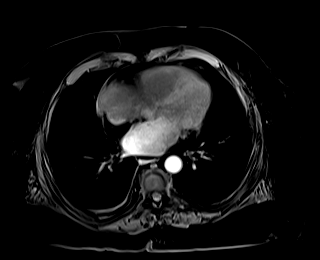

[Series 17: T1 dynamic · axial · 3.0mm · 1.38mm/px · z∈[-231,+30]mm · 3 of 88 slices shown (2 of 10)]
[im 1/88]
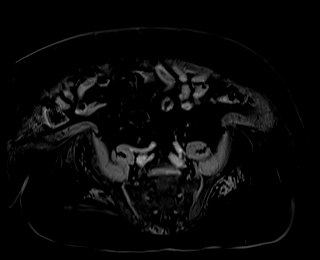
[im 44/88]
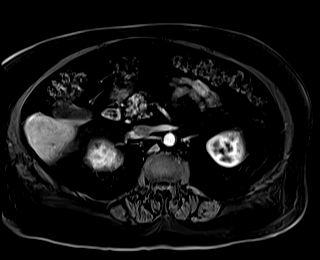
[im 88/88]
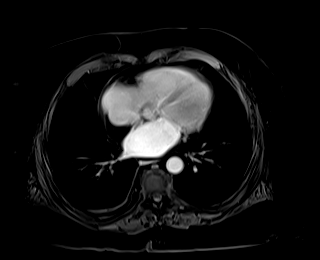

[Series 18: T1 dynamic · axial · 3.0mm · 1.38mm/px · z∈[-231,+30]mm · 3 of 88 slices shown (3 of 10)]
[im 1/88]
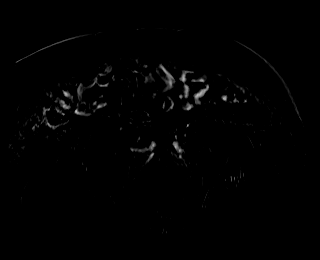
[im 44/88]
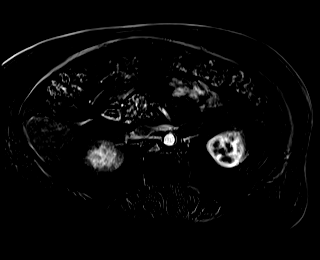
[im 88/88]
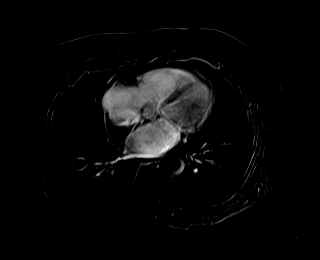

[Series 21: T1 dynamic · axial · 3.0mm · 1.38mm/px · z∈[-231,+30]mm · 3 of 88 slices shown (4 of 10)]
[im 1/88]
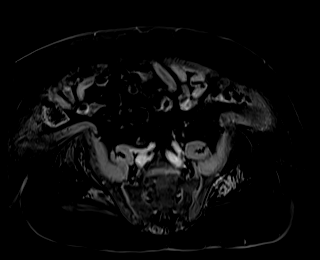
[im 44/88]
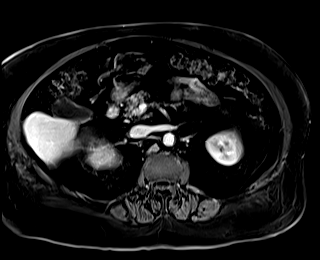
[im 88/88]
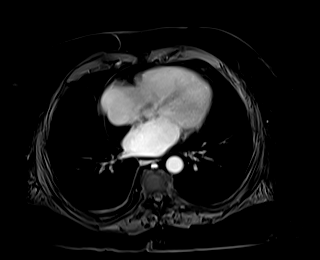

[Series 22: T1 dynamic · axial · 3.0mm · 1.38mm/px · z∈[-231,+30]mm · 3 of 88 slices shown (5 of 10)]
[im 1/88]
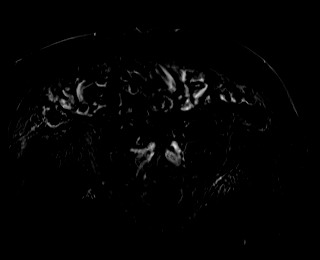
[im 44/88]
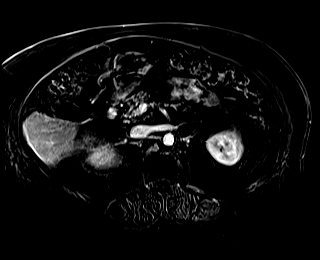
[im 88/88]
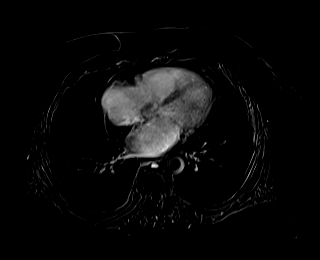

[Series 25: T1 dynamic · axial · 3.0mm · 1.38mm/px · z∈[-231,+30]mm · 3 of 88 slices shown (6 of 10)]
[im 1/88]
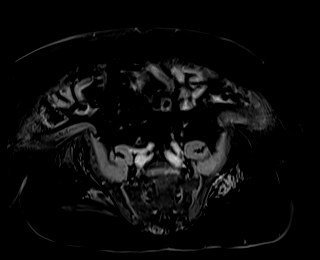
[im 44/88]
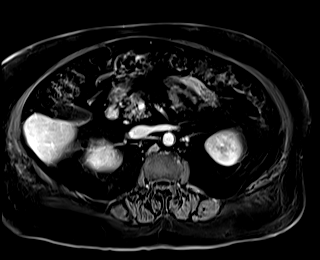
[im 88/88]
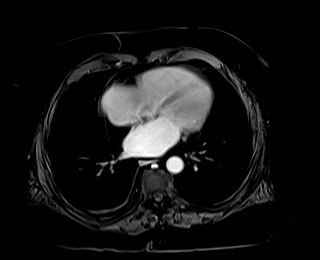

[Series 26: T1 dynamic · axial · 3.0mm · 1.38mm/px · z∈[-231,+30]mm · 3 of 88 slices shown (7 of 10)]
[im 1/88]
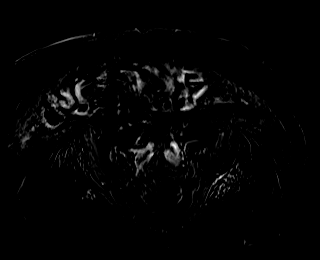
[im 44/88]
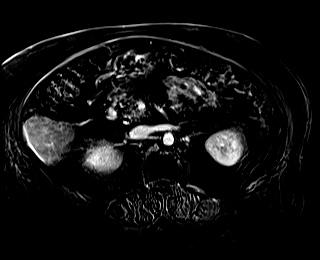
[im 88/88]
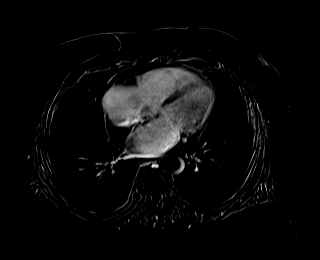

[Series 28: T1 dynamic · coronal · 4.0mm · 1.41mm/px · 3 of 88 slices shown (8 of 10)]
[im 1/88]
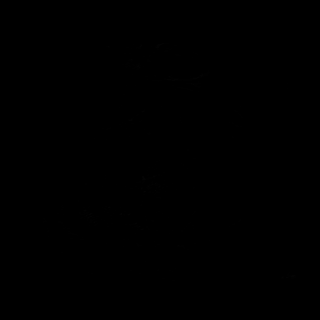
[im 44/88]
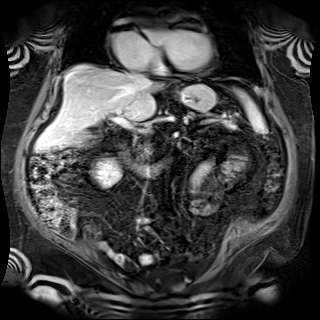
[im 88/88]
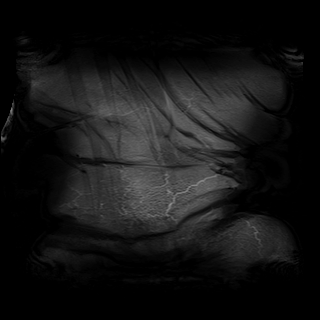

[Series 29: T2 · axial · 6.0mm · 1.76mm/px · 1 of 30 slices shown (2 of 2)]
[im 1/30]
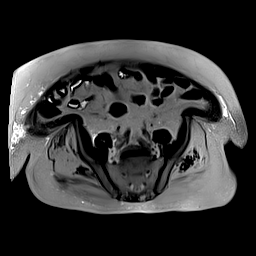

[Series 32: T1 dynamic · axial · 3.0mm · 1.38mm/px · z∈[-231,+30]mm · 3 of 88 slices shown (9 of 10)]
[im 1/88]
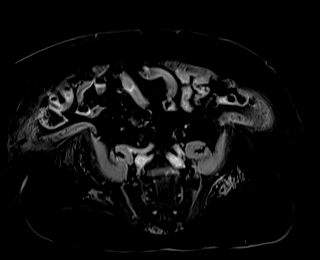
[im 44/88]
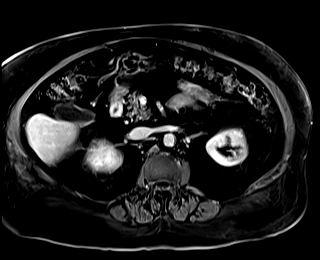
[im 88/88]
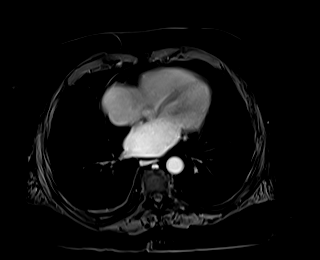

[Series 33: T1 dynamic · axial · 3.0mm · 1.38mm/px · z∈[-231,+30]mm · 3 of 88 slices shown (10 of 10)]
[im 1/88]
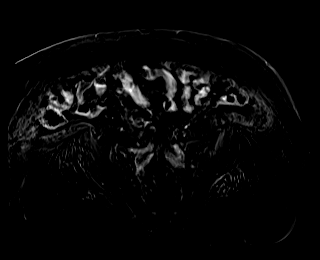
[im 44/88]
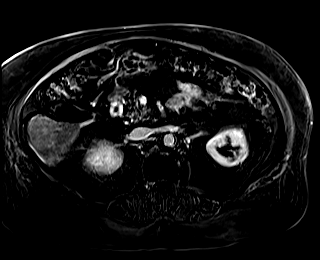
[im 88/88]
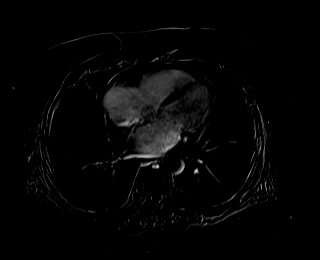

[48 of 48 positions shown; findings below may reference images not displayed]

FINDINGS: Somewhat limited due to motion.

COMBINED FINDINGS FOR BOTH MR ABDOMEN AND PELVIS

Lower chest: No acute findings.

Hepatobiliary: Liver is normal in size and contour with no
suspicious mass identified. Gallbladder appears normal. No biliary
ductal dilatation.

Pancreas: Mildly atrophic with no mass or ductal dilatation
identified.

Spleen:  Within normal limits in size and appearance.

Adrenals/Urinary Tract: No masses identified. No evidence of
hydronephrosis. No urinary bladder mass identified.

Stomach/Bowel: No evidence of bowel obstruction. No bowel wall edema
or abnormal enhancement identified.

Vascular/Lymphatic: No pathologically enlarged lymph nodes
identified. No abdominal aortic aneurysm demonstrated.

Reproductive: Prostate gland normal size.

Other:  No ascites.

Musculoskeletal: No suspicious bony lesions identified. Note is made
of overall atrophy of the musculature.
IMPRESSION: No acute process or suspicious mass identified in the abdomen or
pelvis.

## 2021-11-17 MED ORDER — GADOBUTROL 1 MMOL/ML IV SOLN
10.0000 mL | Freq: Once | INTRAVENOUS | Status: AC | PRN
  Administered 2021-11-17: 10 mL via INTRAVENOUS

## 2021-12-02 ENCOUNTER — Encounter: Payer: Self-pay | Admitting: Gastroenterology

## 2021-12-02 ENCOUNTER — Ambulatory Visit (INDEPENDENT_AMBULATORY_CARE_PROVIDER_SITE_OTHER): Payer: Medicare Other | Admitting: Gastroenterology

## 2021-12-02 VITALS — BP 124/72 | HR 81 | Ht 67.0 in | Wt 213.0 lb

## 2021-12-02 DIAGNOSIS — K625 Hemorrhage of anus and rectum: Secondary | ICD-10-CM

## 2021-12-02 DIAGNOSIS — R634 Abnormal weight loss: Secondary | ICD-10-CM | POA: Diagnosis not present

## 2021-12-02 DIAGNOSIS — R772 Abnormality of alphafetoprotein: Secondary | ICD-10-CM

## 2021-12-02 DIAGNOSIS — R932 Abnormal findings on diagnostic imaging of liver and biliary tract: Secondary | ICD-10-CM

## 2021-12-02 NOTE — Patient Instructions (Signed)
It was a pleasure to see you today. ? ?I am delighted to hear that you are feeling better. ? ?Please let me know if your bleeding returns, as we would want to reconsider a colonoscopy and an upper endoscopy.  ? ?Although your CT scan showed some liver changes, these were not confirmed on the "better" test of the liver, the MRI. In addition, you lab tests do not support a diagnosis of cirrhosis. ? ?Your AFP is mildly elevated. This is only mildly elevated. I recommend that you repeat this in 3-6 months to make sure that it doesn't increase. When an AFP is elevated due to cancer it is usually much higher than yours is at this time.  ? ?Please let me know if I can help at all in the future.  ?

## 2021-12-02 NOTE — Progress Notes (Signed)
? ? ?Referring Provider: Pomposini, Cherly Anderson, MD ?Primary Care Physician:  Pomposini, Cherly Anderson, MD ? ? ?Chief complaint:  Cirrhosis on CT ? ? ?IMPRESSION:  ?Unexplained weight loss of 25 pounds. He and his daughter think he has started to regain weight. Would consider endoscopic evaluation if weight loss continues without obvious source.  ? ?Abnormal labs including hypercalcemia, elevated CRP, and an elevated sedimentation rate. Elevated BNP.  Labs have reportedly improved with steroids.  ? ?Abnormal liver imaging on a noncontrasted CT scan.  Liver appeared normal on a contrasted MRI.  Liver enzymes are normal.  Platelets are normal.  I think underlying liver disease as a cause of his symptoms or recent weight loss is unlikely. ? ?Mildly elevated AFP without evidence for underlying liver disease.  Testing is negative for chronic hepatitis B, chronic hepatitis C, biliary obstruction, or liver tumor on CT or MRI.  Transaminases are normal.  Must consider other causes of an elevated AFP.  Repeat in 3-6 months.  If the AFP continues to increase would consider oncology consultation to consider nonliver related tumors as the cause of the elevation. ? ?Recent rectal bleeding: No other associated symptoms. CT and MRI showed normal colon. No further bleeding since the steroids. Would recommend endoscopic evaluation if bleeding returns as the steroids may have treated a chronic inflammatory colitis. ? ?Atrophic pancreas on imaging: No associated symptoms. Consider pancreatic elastase with the development of symptoms. ? ?  ? ?PLAN: ?- EGD and colonoscopy after a Xarelto washout with any additional weight loss or blood in the stool. Would need to be performed at the hospital.  ?- Pancreatic elastase with any change in bowel habits or bloating ? ? ?HPI: Braxdon Gappa is a 76 y.o. male who returns in follow-up.  His daughter accompanies him to this appointment and contributes to the history. She is the team leader RN for a  Urology practice. Initial consultation was performed by Nicoletta Ba 11/06/2021.  Please see her note for complete details. This is my first office visit with Mr. On.  He has multiple chronic medical problems including postpolio syndrome, atrial fibrillation on chronic Xarelto, obesity, hyperlipidemia, hypertension, diabetes, COPD, and sleep apnea requiring CPAP. ? ?He had the onset of weakness, fatigue, and anorexia in November 2022 with an associated 25 pound weight loss, sedimentation rate over 120, and CRP of 11.1.  Calcium was also found to be elevated.  Onset of symptoms coincided with an episode of altered mental status and slurred speech.  Noncontrast head CT did not show any acute findings but did show an old lacunar infarct and some white matter disease with global atrophy.  There was a suspicion that maybe he had a TIA. ? ?There have been no significant GI symptoms. Except his daughter reports seeing some blood on the stool earlier in the course of his symptoms.  ? ?The evaluation for his weight loss included a noncontrasted CT of the abdomen and pelvis that was significant for new diagnosis of cirrhosis and diverticulosis.  There was no lymphadenopathy. ? ?Follow-up MRI of the abdomen and pelvis with and without contrast as recommended by Nicoletta Ba 11/17/2021 revealed pancreatic atrophy and was otherwise normal.  In particular, the liver appeared normal.  There was no evidence for portal hypertension.  No cause for weight loss identified. ? ?Labs earlier this month show a ferritin of 35.3, albumin 3.4, total bilirubin 0.7, AST 19, ALT 15, alk phos 96, ANA negative, AMA negative, smooth muscle antibody negative, ceruloplasmin normal,  CEA less than 2, AFP elevated at 6.4, and negative viral serologies for chronic hepatitis B and chronic hepatitis C. ? ?Labs from 07/2021 show a normal CBC ? ?A CT of the chest was performed locally and showed no acute findings. CRP 11/19/21 was <0.4. ? ?He returns  today in scheduled follow-up.  His fatigue has improved.  He has more energy, his appetite has improved, and he feels that he is gaining weight.  He has been able to get up and get around with a walker.  His primary care doctor suspected this may be due to an underlying systemic inflammatory process that responded to steroids.  ? ?He reported anemia to the intake staff but I have no documentation of a recent history of anemia and he and his daughter deny any anemia during our dicussion. ? ?He thinks he had a colonoscopy >10 years ago in [place]. No polyps seen at that time.  He would like to avoid further endoscopic evaluation.  ? ? ? ?Past Medical History:  ?Diagnosis Date  ? A-fib ([place])   ? COPD (chronic obstructive pulmonary disease) ([place])   ? High cholesterol   ? Hypertension   ? Polio   ? ? ?Past Surgical History:  ?Procedure Laterality Date  ? right knee replacement    ? rotator cuff surgery Left   ? ? ?Current Outpatient Medications  ?Medication Sig Dispense Refill  ? albuterol (VENTOLIN HFA) 108 (90 Base) MCG/ACT inhaler Inhale into the lungs.    ? Cholecalciferol 50 MCG (2000 UT) CAPS Take 1 capsule by mouth daily.    ? citalopram (CELEXA) 20 MG tablet Take 20 mg by mouth daily.    ? docusate sodium (COLACE) 100 MG capsule Take 100 mg by mouth daily.    ? fluticasone (FLONASE) 50 MCG/ACT nasal spray Place 2 sprays into both nostrils daily as needed for allergies.    ? glimepiride (AMARYL) 2 MG tablet Take 2 mg by mouth daily with breakfast.    ? metoprolol tartrate (LOPRESSOR) 100 MG tablet Take 100 mg by mouth 2 (two) times daily.    ? Misc Natural Products (NEURIVA PO) Take 1 tablet by mouth daily.    ? mupirocin ointment (BACTROBAN) 2 % Apply topically daily.    ? pantoprazole (PROTONIX) 40 MG tablet Take 40 mg by mouth daily.    ? rivaroxaban (XARELTO) 20 MG TABS tablet Take 20 mg by mouth daily.    ? rosuvastatin (CRESTOR) 10 MG tablet Take 10 mg by mouth daily.    ? SYMBICORT 160-4.5 MCG/ACT inhaler  Inhale 2 puffs into the lungs 2 (two) times daily.    ? Vibegron (GEMTESA) 75 MG TABS Take 1 tablet by mouth daily.    ? ?No current facility-administered medications for this visit.  ? ? ?Allergies as of 12/02/2021 - Review Complete 12/02/2021  ?Allergen Reaction Noted  ? Metformin Diarrhea 11/06/2021  ? ? ?Family History  ?Problem Relation Age of Onset  ? Dementia Mother   ? Hypertension Mother   ? Hypertension Father   ? Heart attack Father   ? Colon polyps Neg Hx   ? Colon cancer Neg Hx   ? ? ? ? ?Physical Exam: ?Vital signs reviewed.  ?Weight 11/06/2021 217 pounds ?Weight today 213 pounds ?General:   Alert, in NAD. No scleral icterus. No bilateral temporal wasting, examined in a wheel chair.  ?Heart:  Regular rate and rhythm; no murmurs ?Pulm: Clear anteriorly; no wheezing ?Abdomen:  Soft. Central obesity. Nontender. Nondistended.  Normal bowel sounds. No rebound or guarding. No fluid wave.  ?LAD: No inguinal or umbilical LAD ?Extremities:  Without edema. Multiple echomoses on the upper endoscopy that he attributes to a fall over the weekend.  ?Neurologic:  Alert and  oriented x4;  grossly normal neurologically; no asterixis or clonus. ?Skin: No jaundice. No palmar erythema or spider angioma.  ?Psych:  Alert and cooperative. Normal mood and affect.  ? ?I spent over 40 minutes, including in depth chart review, independent review of results, communicating results with the patient directly, face-to-face time with the patient, coordinating care, ordering studies and medications as appropriate, and documentation.  ? ? ?Raechel Marcos L. Tarri Glenn, MD, MPH ?12/03/2021, 9:05 PM ? ? ? ?  ?

## 2021-12-03 ENCOUNTER — Telehealth: Payer: Self-pay | Admitting: Gastroenterology

## 2021-12-03 ENCOUNTER — Encounter: Payer: Self-pay | Admitting: Gastroenterology

## 2021-12-03 NOTE — Telephone Encounter (Signed)
Inbound call from patients daughter stated that she forgot to ask for a doctors note for her at work. Patient is requesting to her place of employment.  ? ?[facility] ?[place] ? ?Phone number: [phone] ? ? ?Fax Number: [phone]  ?

## 2021-12-07 NOTE — Telephone Encounter (Signed)
Doctor note faxed. ?

## 2022-09-13 ENCOUNTER — Inpatient Hospital Stay (HOSPITAL_COMMUNITY)
Admission: EM | Admit: 2022-09-13 | Discharge: 2022-09-20 | DRG: 603 | Disposition: A | Discharge status: Skilled Nursing Facility | Payer: Medicare Other | Attending: Internal Medicine | Admitting: Internal Medicine

## 2022-09-13 ENCOUNTER — Other Ambulatory Visit: Payer: Self-pay

## 2022-09-13 ENCOUNTER — Emergency Department (HOSPITAL_COMMUNITY): Payer: Medicare Other

## 2022-09-13 ENCOUNTER — Encounter (HOSPITAL_COMMUNITY): Payer: Self-pay | Admitting: Emergency Medicine

## 2022-09-13 DIAGNOSIS — Z7951 Long term (current) use of inhaled steroids: Secondary | ICD-10-CM

## 2022-09-13 DIAGNOSIS — F05 Delirium due to known physiological condition: Secondary | ICD-10-CM | POA: Diagnosis present

## 2022-09-13 DIAGNOSIS — R531 Weakness: Secondary | ICD-10-CM | POA: Diagnosis not present

## 2022-09-13 DIAGNOSIS — L03119 Cellulitis of unspecified part of limb: Secondary | ICD-10-CM | POA: Diagnosis present

## 2022-09-13 DIAGNOSIS — Z7901 Long term (current) use of anticoagulants: Secondary | ICD-10-CM

## 2022-09-13 DIAGNOSIS — Z1152 Encounter for screening for COVID-19: Secondary | ICD-10-CM

## 2022-09-13 DIAGNOSIS — E782 Mixed hyperlipidemia: Secondary | ICD-10-CM | POA: Diagnosis present

## 2022-09-13 DIAGNOSIS — Z79899 Other long term (current) drug therapy: Secondary | ICD-10-CM

## 2022-09-13 DIAGNOSIS — J449 Chronic obstructive pulmonary disease, unspecified: Secondary | ICD-10-CM | POA: Diagnosis present

## 2022-09-13 DIAGNOSIS — Z87891 Personal history of nicotine dependence: Secondary | ICD-10-CM

## 2022-09-13 DIAGNOSIS — Z9181 History of falling: Secondary | ICD-10-CM

## 2022-09-13 DIAGNOSIS — G14 Postpolio syndrome: Secondary | ICD-10-CM | POA: Diagnosis present

## 2022-09-13 DIAGNOSIS — Z888 Allergy status to other drugs, medicaments and biological substances status: Secondary | ICD-10-CM

## 2022-09-13 DIAGNOSIS — Z8612 Personal history of poliomyelitis: Secondary | ICD-10-CM | POA: Diagnosis present

## 2022-09-13 DIAGNOSIS — Z8249 Family history of ischemic heart disease and other diseases of the circulatory system: Secondary | ICD-10-CM

## 2022-09-13 DIAGNOSIS — L02415 Cutaneous abscess of right lower limb: Secondary | ICD-10-CM | POA: Diagnosis present

## 2022-09-13 DIAGNOSIS — G4733 Obstructive sleep apnea (adult) (pediatric): Secondary | ICD-10-CM | POA: Diagnosis present

## 2022-09-13 DIAGNOSIS — L03115 Cellulitis of right lower limb: Secondary | ICD-10-CM | POA: Diagnosis not present

## 2022-09-13 DIAGNOSIS — Z7984 Long term (current) use of oral hypoglycemic drugs: Secondary | ICD-10-CM

## 2022-09-13 DIAGNOSIS — I10 Essential (primary) hypertension: Secondary | ICD-10-CM | POA: Diagnosis present

## 2022-09-13 DIAGNOSIS — E785 Hyperlipidemia, unspecified: Secondary | ICD-10-CM | POA: Diagnosis present

## 2022-09-13 DIAGNOSIS — R296 Repeated falls: Secondary | ICD-10-CM | POA: Diagnosis present

## 2022-09-13 DIAGNOSIS — R627 Adult failure to thrive: Secondary | ICD-10-CM | POA: Diagnosis present

## 2022-09-13 DIAGNOSIS — Z96651 Presence of right artificial knee joint: Secondary | ICD-10-CM | POA: Diagnosis present

## 2022-09-13 DIAGNOSIS — I4891 Unspecified atrial fibrillation: Secondary | ICD-10-CM | POA: Diagnosis present

## 2022-09-13 DIAGNOSIS — R32 Unspecified urinary incontinence: Secondary | ICD-10-CM | POA: Diagnosis present

## 2022-09-13 DIAGNOSIS — H15009 Unspecified scleritis, unspecified eye: Secondary | ICD-10-CM | POA: Diagnosis present

## 2022-09-13 DIAGNOSIS — M069 Rheumatoid arthritis, unspecified: Secondary | ICD-10-CM | POA: Diagnosis present

## 2022-09-13 DIAGNOSIS — Z7401 Bed confinement status: Secondary | ICD-10-CM

## 2022-09-13 DIAGNOSIS — E869 Volume depletion, unspecified: Secondary | ICD-10-CM | POA: Diagnosis not present

## 2022-09-13 DIAGNOSIS — N179 Acute kidney failure, unspecified: Secondary | ICD-10-CM | POA: Diagnosis not present

## 2022-09-13 LAB — BASIC METABOLIC PANEL
Anion gap: 15 (ref 5–15)
BUN: 22 mg/dL (ref 8–23)
CO2: 24 mmol/L (ref 22–32)
Calcium: 9.7 mg/dL (ref 8.9–10.3)
Chloride: 96 mmol/L — ABNORMAL LOW (ref 98–111)
Creatinine, Ser: 1.1 mg/dL (ref 0.61–1.24)
GFR, Estimated: >60 mL/min (ref >60)
Glucose, Bld: 197 mg/dL — ABNORMAL HIGH (ref 70–99)
Potassium: 3.6 mmol/L (ref 3.5–5.1)
Sodium: 135 mmol/L (ref 135–145)

## 2022-09-13 LAB — DIFFERENTIAL
Abs Immature Granulocytes: 0.3 10*3/uL — ABNORMAL HIGH (ref 0.00–0.07)
Basophils Absolute: 0.1 10*3/uL (ref 0.0–0.1)
Basophils Relative: 1 %
Eosinophils Absolute: 0 10*3/uL (ref 0.0–0.5)
Eosinophils Relative: 0 %
Immature Granulocytes: 2 %
Lymphocytes Relative: 8 %
Lymphs Abs: 1.1 10*3/uL (ref 0.7–4.0)
Monocytes Absolute: 0.9 10*3/uL (ref 0.1–1.0)
Monocytes Relative: 7 %
Neutro Abs: 11.6 10*3/uL — ABNORMAL HIGH (ref 1.7–7.7)
Neutrophils Relative %: 82 %

## 2022-09-13 LAB — CBC
HCT: 52.6 % — ABNORMAL HIGH (ref 39.0–52.0)
Hemoglobin: 17 g/dL (ref 13.0–17.0)
MCH: 30.4 pg (ref 26.0–34.0)
MCHC: 32.3 g/dL (ref 30.0–36.0)
MCV: 93.9 fL (ref 80.0–100.0)
Platelets: 189 10*3/uL (ref 150–400)
RBC: 5.6 MIL/uL (ref 4.22–5.81)
RDW: 15.9 % — ABNORMAL HIGH (ref 11.5–15.5)
WBC: 14.3 10*3/uL — ABNORMAL HIGH (ref 4.0–10.5)
nRBC: 0 % (ref 0.0–0.2)

## 2022-09-13 LAB — HEPATIC FUNCTION PANEL
ALT: 32 U/L (ref 0–44)
AST: 33 U/L (ref 15–41)
Albumin: 3.3 g/dL — ABNORMAL LOW (ref 3.5–5.0)
Alkaline Phosphatase: 69 U/L (ref 38–126)
Bilirubin, Direct: 0.2 mg/dL (ref 0.0–0.2)
Indirect Bilirubin: 0.6 mg/dL (ref 0.3–0.9)
Total Bilirubin: 0.8 mg/dL (ref 0.3–1.2)
Total Protein: 6.6 g/dL (ref 6.5–8.1)

## 2022-09-13 NOTE — ED Triage Notes (Signed)
Pt feels weak and has had multiple falls since Thanksgiving. Per family he is now incontinent and has slurred speech at times.

## 2022-09-13 NOTE — ED Provider Notes (Signed)
[facility] EMERGENCY DEPARTMENT Provider Note   CSN: [id number] Arrival date & time: 09/13/22  2141     History {Add pertinent medical, surgical, social history, OB history to HPI:1} Chief Complaint  Patient presents with   Weakness    Alexander Johnson is a 77 y.o. male.   Weakness Patient generalized weakness.  Has been worsening particularly since Thanksgiving.  Has had recurrent falls.  Pretty much bedbound.  Required 3 people to be able to get him to the bed here.  Reportedly has had increasing urinary incontinence.  Has seen neurology for it.  Thought to be secondary to overactive bladder.  Start medicines.  Now at times more slurred speech and more weakness.  Has had falls.  Denies hitting head.  Is on blood thinners.  Has had polio previously and has weakness on his left leg to start with.  States at times his right leg will feel weak.    Past Medical History:  Diagnosis Date   A-fib [facility])    COPD (chronic obstructive pulmonary disease) ([facility])    High cholesterol    Hypertension    Polio     Home Medications Prior to Admission medications   Medication Sig Start Date End Date Taking? Authorizing Provider  albuterol (VENTOLIN HFA) 108 (90 Base) MCG/ACT inhaler Inhale into the lungs.    [provider]  Cholecalciferol 50 MCG (2000 UT) CAPS Take 1 capsule by mouth daily.    [provider]  citalopram (CELEXA) 20 MG tablet Take 20 mg by mouth daily. 04/24/12   [provider]  docusate sodium (COLACE) 100 MG capsule Take 100 mg by mouth daily.    [provider]  fluticasone (FLONASE) 50 MCG/ACT nasal spray Place 2 sprays into both nostrils daily as needed for allergies. 05/02/12   [provider]  glimepiride (AMARYL) 2 MG tablet Take 2 mg by mouth daily with breakfast.    [provider]  metoprolol tartrate (LOPRESSOR) 100 MG tablet Take 100 mg by mouth 2 (two) times daily.    [provider]  Misc Natural  Products (NEURIVA PO) Take 1 tablet by mouth daily.    [provider]  mupirocin ointment (BACTROBAN) 2 % Apply topically daily. 03/26/21   [provider]  pantoprazole (PROTONIX) 40 MG tablet Take 40 mg by mouth daily. 08/06/14   [provider]  rivaroxaban (XARELTO) 20 MG TABS tablet Take 20 mg by mouth daily.    [provider]  rosuvastatin (CRESTOR) 10 MG tablet Take 10 mg by mouth daily.    [provider]  SYMBICORT 160-4.5 MCG/ACT inhaler Inhale 2 puffs into the lungs 2 (two) times daily. 02/26/21   [provider]  Vibegron (GEMTESA) 75 MG TABS Take 1 tablet by mouth daily.    [provider]      Allergies    Metformin    Review of Systems   Review of Systems  Neurological:  Positive for weakness.    Physical Exam Updated Vital Signs BP (!) 126/97   Pulse 76   Temp 98.2 F (36.8 C) (Oral)   Resp 14   Ht 5\' 7"  (1.702 m)   Wt 97.5 kg   SpO2 95%   BMI 33.67 kg/m  Physical Exam Vitals and nursing note reviewed.  Constitutional:      Appearance: He is obese.  HENT:     Mouth/Throat:     Mouth: Mucous membranes are moist.  Eyes:  Pupils: Pupils are equal, round, and reactive to light.  Cardiovascular:     Rate and Rhythm: Normal rate. Rhythm irregular.  Abdominal:     Tenderness: There is no abdominal tenderness.  Musculoskeletal:        General: No tenderness.     Cervical back: Neck supple.     Comments: Less musculature on the left lower extremity compared to right.  Chronic.  Able to do mild straight leg raise on right side.  Skin:    General: Skin is warm.     Capillary Refill: Capillary refill takes less than 2 seconds.     Comments: Few superficial wounds on right lower extremity.  Neurological:     Mental Status: He is alert.     ED Results / Procedures / Treatments   Labs (all labs ordered are listed, but only abnormal results are displayed) Labs Reviewed  BASIC METABOLIC PANEL -  Abnormal; Notable for the following components:      Result Value   Chloride 96 (*)    Glucose, Bld 197 (*)    All other components within normal limits  CBC - Abnormal; Notable for the following components:   WBC 14.3 (*)    HCT 52.6 (*)    RDW 15.9 (*)    All other components within normal limits  HEPATIC FUNCTION PANEL - Abnormal; Notable for the following components:   Albumin 3.3 (*)    All other components within normal limits  DIFFERENTIAL - Abnormal; Notable for the following components:   Neutro Abs 11.6 (*)    Abs Immature Granulocytes 0.30 (*)    All other components within normal limits  URINALYSIS, ROUTINE W REFLEX MICROSCOPIC    EKG EKG Interpretation  Date/Time:  Monday September 13 2022 22:07:25 EST Ventricular Rate:  99 PR Interval:    QRS Duration: 82 QT Interval:  352 QTC Calculation: 451 R Axis:   25 Text Interpretation: Atrial fibrillation Nonspecific ST and T wave abnormality Abnormal ECG No previous ECGs available Confirmed by Davonna Belling [phone]) on 09/13/2022 10:29:51 PM  Radiology No results found.  Procedures Procedures  {Document cardiac monitor, telemetry assessment procedure when appropriate:1}  Medications Ordered in ED Medications - No data to display  ED Course/ Medical Decision Making/ A&P                           Medical Decision Making Amount and/or Complexity of Data Reviewed Labs: ordered. Radiology: ordered.   Patient with generalized weakness.  Multiple falls.  Increasingly bedbound.  Increasing falls.  Generalized weakness.  Lab work does show a leukocytosis.  EKG shows A-fib which patient has had before.  Good kidney function.  Glucose mildly elevated.  Will check urinalysis to look for infection.  Will also get head CT and chest x-ray to evaluate for infection and other causes such as stroke or intracranial hemorrhage.  Has had unintended loss of weight and has seen GI for this in the past.  {Document critical care time  when appropriate:1} {Document review of labs and clinical decision tools ie heart score, Chads2Vasc2 etc:1}  {Document your independent review of radiology images, and any outside records:1} {Document your discussion with family members, caretakers, and with consultants:1} {Document social determinants of health affecting pt's care:1} {Document your decision making why or why not admission, treatments were needed:1} Final Clinical Impression(s) / ED Diagnoses Final diagnoses:  None    Rx / DC Orders ED Discharge Orders  None       

## 2022-09-14 ENCOUNTER — Observation Stay (HOSPITAL_COMMUNITY): Payer: Medicare Other

## 2022-09-14 DIAGNOSIS — J439 Emphysema, unspecified: Secondary | ICD-10-CM

## 2022-09-14 DIAGNOSIS — R531 Weakness: Secondary | ICD-10-CM

## 2022-09-14 DIAGNOSIS — I4891 Unspecified atrial fibrillation: Secondary | ICD-10-CM | POA: Diagnosis not present

## 2022-09-14 DIAGNOSIS — E785 Hyperlipidemia, unspecified: Secondary | ICD-10-CM

## 2022-09-14 DIAGNOSIS — G4733 Obstructive sleep apnea (adult) (pediatric): Secondary | ICD-10-CM | POA: Diagnosis not present

## 2022-09-14 LAB — CBC WITH DIFFERENTIAL/PLATELET
Abs Immature Granulocytes: 0.21 10*3/uL — ABNORMAL HIGH (ref 0.00–0.07)
Basophils Absolute: 0.1 10*3/uL (ref 0.0–0.1)
Basophils Relative: 1 %
Eosinophils Absolute: 0 10*3/uL (ref 0.0–0.5)
Eosinophils Relative: 0 %
HCT: 49.6 % (ref 39.0–52.0)
Hemoglobin: 16.1 g/dL (ref 13.0–17.0)
Immature Granulocytes: 2 %
Lymphocytes Relative: 14 %
Lymphs Abs: 1.8 10*3/uL (ref 0.7–4.0)
MCH: 30.3 pg (ref 26.0–34.0)
MCHC: 32.5 g/dL (ref 30.0–36.0)
MCV: 93.2 fL (ref 80.0–100.0)
Monocytes Absolute: 1 10*3/uL (ref 0.1–1.0)
Monocytes Relative: 7 %
Neutro Abs: 10.2 10*3/uL — ABNORMAL HIGH (ref 1.7–7.7)
Neutrophils Relative %: 76 %
Platelets: 166 10*3/uL (ref 150–400)
RBC: 5.32 MIL/uL (ref 4.22–5.81)
RDW: 15.9 % — ABNORMAL HIGH (ref 11.5–15.5)
WBC: 13.2 10*3/uL — ABNORMAL HIGH (ref 4.0–10.5)
nRBC: 0 % (ref 0.0–0.2)

## 2022-09-14 LAB — URINALYSIS, ROUTINE W REFLEX MICROSCOPIC
Bilirubin Urine: NEGATIVE
Glucose, UA: >=500 mg/dL — AB
Ketones, ur: NEGATIVE mg/dL
Leukocytes,Ua: NEGATIVE
Nitrite: NEGATIVE
Protein, ur: NEGATIVE mg/dL
Specific Gravity, Urine: 1.017 (ref 1.005–1.030)
pH: 5 (ref 5.0–8.0)

## 2022-09-14 LAB — PROCALCITONIN: Procalcitonin: 0.9 ng/mL

## 2022-09-14 LAB — COMPREHENSIVE METABOLIC PANEL
ALT: 30 U/L (ref 0–44)
AST: 23 U/L (ref 15–41)
Albumin: 3.1 g/dL — ABNORMAL LOW (ref 3.5–5.0)
Alkaline Phosphatase: 65 U/L (ref 38–126)
Anion gap: 10 (ref 5–15)
BUN: 19 mg/dL (ref 8–23)
CO2: 28 mmol/L (ref 22–32)
Calcium: 9.6 mg/dL (ref 8.9–10.3)
Chloride: 98 mmol/L (ref 98–111)
Creatinine, Ser: 0.86 mg/dL (ref 0.61–1.24)
GFR, Estimated: >60 mL/min (ref >60)
Glucose, Bld: 136 mg/dL — ABNORMAL HIGH (ref 70–99)
Potassium: 3.3 mmol/L — ABNORMAL LOW (ref 3.5–5.1)
Sodium: 136 mmol/L (ref 135–145)
Total Bilirubin: 0.7 mg/dL (ref 0.3–1.2)
Total Protein: 6.5 g/dL (ref 6.5–8.1)

## 2022-09-14 LAB — TROPONIN I (HIGH SENSITIVITY): Troponin I (High Sensitivity): 8 ng/L (ref <18)

## 2022-09-14 LAB — VITAMIN B12: Vitamin B-12: 555 pg/mL (ref 180–914)

## 2022-09-14 LAB — FOLATE: Folate: 14.2 ng/mL (ref >5.9)

## 2022-09-14 LAB — MAGNESIUM: Magnesium: 1.5 mg/dL — ABNORMAL LOW (ref 1.7–2.4)

## 2022-09-14 LAB — TSH: TSH: 2.622 u[IU]/mL (ref 0.350–4.500)

## 2022-09-14 LAB — CK: Total CK: 31 U/L — ABNORMAL LOW (ref 49–397)

## 2022-09-14 MED ORDER — MORPHINE SULFATE (PF) 2 MG/ML IV SOLN: 1.0000 mg | INTRAVENOUS | Status: DC | PRN

## 2022-09-14 MED ORDER — OXYCODONE HCL 5 MG PO TABS
5.0000 mg | ORAL_TABLET | ORAL | Status: DC | PRN
  Administered 2022-09-15 (×2): 5 mg via ORAL
  Filled 2022-09-14 (×2): qty 1

## 2022-09-14 MED ORDER — POTASSIUM CHLORIDE CRYS ER 20 MEQ PO TBCR
40.0000 meq | EXTENDED_RELEASE_TABLET | Freq: Once | ORAL | Status: AC
  Administered 2022-09-14: 40 meq via ORAL
  Filled 2022-09-14: qty 2

## 2022-09-14 MED ORDER — MORPHINE SULFATE (PF) 2 MG/ML IV SOLN: 2.0000 mg | INTRAVENOUS | Status: DC | PRN

## 2022-09-14 MED ORDER — CITALOPRAM HYDROBROMIDE 20 MG PO TABS
20.0000 mg | ORAL_TABLET | Freq: Every day | ORAL | Status: DC
  Administered 2022-09-14 – 2022-09-17 (×4): 20 mg via ORAL
  Filled 2022-09-14 (×4): qty 1

## 2022-09-14 MED ORDER — RIVAROXABAN 20 MG PO TABS
20.0000 mg | ORAL_TABLET | Freq: Every day | ORAL | Status: DC
  Administered 2022-09-14 – 2022-09-20 (×7): 20 mg via ORAL
  Filled 2022-09-14 (×7): qty 1

## 2022-09-14 MED ORDER — ONDANSETRON HCL 4 MG PO TABS: 4.0000 mg | ORAL_TABLET | Freq: Four times a day (QID) | ORAL | Status: DC | PRN

## 2022-09-14 MED ORDER — MOMETASONE FURO-FORMOTEROL FUM 200-5 MCG/ACT IN AERO
2.0000 | INHALATION_SPRAY | Freq: Two times a day (BID) | RESPIRATORY_TRACT | Status: DC
  Administered 2022-09-14 – 2022-09-20 (×13): 2 via RESPIRATORY_TRACT
  Filled 2022-09-14: qty 8.8

## 2022-09-14 MED ORDER — ALBUTEROL SULFATE (2.5 MG/3ML) 0.083% IN NEBU: 2.5000 mg | INHALATION_SOLUTION | RESPIRATORY_TRACT | Status: DC | PRN

## 2022-09-14 MED ORDER — ROSUVASTATIN CALCIUM 10 MG PO TABS
10.0000 mg | ORAL_TABLET | Freq: Every day | ORAL | Status: DC
  Administered 2022-09-14 – 2022-09-20 (×7): 10 mg via ORAL
  Filled 2022-09-14 (×7): qty 1

## 2022-09-14 MED ORDER — ONDANSETRON HCL 4 MG/2ML IJ SOLN: 4.0000 mg | Freq: Four times a day (QID) | INTRAMUSCULAR | Status: DC | PRN

## 2022-09-14 MED ORDER — METOPROLOL TARTRATE 50 MG PO TABS
100.0000 mg | ORAL_TABLET | Freq: Two times a day (BID) | ORAL | Status: DC
  Administered 2022-09-14 – 2022-09-20 (×12): 100 mg via ORAL
  Filled 2022-09-14 (×12): qty 2

## 2022-09-14 MED ORDER — PANTOPRAZOLE SODIUM 40 MG PO TBEC
40.0000 mg | DELAYED_RELEASE_TABLET | Freq: Every day | ORAL | Status: DC
  Administered 2022-09-14 – 2022-09-20 (×7): 40 mg via ORAL
  Filled 2022-09-14 (×7): qty 1

## 2022-09-14 MED ORDER — ACETAMINOPHEN 650 MG RE SUPP: 650.0000 mg | Freq: Four times a day (QID) | RECTAL | Status: DC | PRN

## 2022-09-14 MED ORDER — ALBUTEROL SULFATE HFA 108 (90 BASE) MCG/ACT IN AERS: 1.0000 | INHALATION_SPRAY | RESPIRATORY_TRACT | Status: DC | PRN

## 2022-09-14 MED ORDER — MAGNESIUM SULFATE 4 GM/100ML IV SOLN
4.0000 g | Freq: Once | INTRAVENOUS | Status: AC
  Administered 2022-09-14: 4 g via INTRAVENOUS
  Filled 2022-09-14: qty 100

## 2022-09-14 MED ORDER — ACETAMINOPHEN 325 MG PO TABS: 650.0000 mg | ORAL_TABLET | Freq: Four times a day (QID) | ORAL | Status: DC | PRN

## 2022-09-14 NOTE — Assessment & Plan Note (Signed)
-   Continue Xarelto and metoprolol 

## 2022-09-14 NOTE — Progress Notes (Signed)
ASSUMPTION OF CARE NOTE   09/14/2022 6:42 PM  Alexander Johnson was seen and examined.  The H&P by the admitting provider, orders, imaging was reviewed.  Please see new orders.  Will continue to follow.    Vitals:   09/14/22 1300 09/14/22 1640  BP: 111/87 109/88  Pulse: 97 92  Resp: 19 20  Temp:  97.6 F (36.4 C)  SpO2: 96%     Results for orders placed or performed during the hospital encounter of 09/13/22  Basic metabolic panel  Result Value Ref Range   Sodium 135 135 - 145 mmol/L   Potassium 3.6 3.5 - 5.1 mmol/L   Chloride 96 (L) 98 - 111 mmol/L   CO2 24 22 - 32 mmol/L   Glucose, Bld 197 (H) 70 - 99 mg/dL   BUN 22 8 - 23 mg/dL   Creatinine, Ser 1.44 0.61 - 1.24 mg/dL   Calcium 9.7 8.9 - 81.8 mg/dL   GFR, Estimated >56 >31 mL/min   Anion gap 15 5 - 15  CBC  Result Value Ref Range   WBC 14.3 (H) 4.0 - 10.5 K/uL   RBC 5.60 4.22 - 5.81 MIL/uL   Hemoglobin 17.0 13.0 - 17.0 g/dL   HCT 49.7 (H) 02.6 - 37.8 %   MCV 93.9 80.0 - 100.0 fL   MCH 30.4 26.0 - 34.0 pg   MCHC 32.3 30.0 - 36.0 g/dL   RDW 58.8 (H) 50.2 - 77.4 %   Platelets 189 150 - 400 K/uL   nRBC 0.0 0.0 - 0.2 %  Urinalysis, Routine w reflex microscopic Urine, In & Out Cath  Result Value Ref Range   Color, Urine YELLOW YELLOW   APPearance CLEAR CLEAR   Specific Gravity, Urine 1.017 1.005 - 1.030   pH 5.0 5.0 - 8.0   Glucose, UA >=500 (A) NEGATIVE mg/dL   Hgb urine dipstick MODERATE (A) NEGATIVE   Bilirubin Urine NEGATIVE NEGATIVE   Ketones, ur NEGATIVE NEGATIVE mg/dL   Protein, ur NEGATIVE NEGATIVE mg/dL   Nitrite NEGATIVE NEGATIVE   Leukocytes,Ua NEGATIVE NEGATIVE   RBC / HPF 0-5 0 - 5 RBC/hpf   WBC, UA 0-5 0 - 5 WBC/hpf   Bacteria, UA RARE (A) NONE SEEN   Squamous Epithelial / HPF 0-5 0 - 5 /HPF  Hepatic function panel  Result Value Ref Range   Total Protein 6.6 6.5 - 8.1 g/dL   Albumin 3.3 (L) 3.5 - 5.0 g/dL   AST 33 15 - 41 U/L   ALT 32 0 - 44 U/L   Alkaline Phosphatase 69 38 - 126 U/L   Total  Bilirubin 0.8 0.3 - 1.2 mg/dL   Bilirubin, Direct 0.2 0.0 - 0.2 mg/dL   Indirect Bilirubin 0.6 0.3 - 0.9 mg/dL  Differential  Result Value Ref Range   Neutrophils Relative % 82 %   Neutro Abs 11.6 (H) 1.7 - 7.7 K/uL   Lymphocytes Relative 8 %   Lymphs Abs 1.1 0.7 - 4.0 K/uL   Monocytes Relative 7 %   Monocytes Absolute 0.9 0.1 - 1.0 K/uL   Eosinophils Relative 0 %   Eosinophils Absolute 0.0 0.0 - 0.5 K/uL   Basophils Relative 1 %   Basophils Absolute 0.1 0.0 - 0.1 K/uL   Immature Granulocytes 2 %   Abs Immature Granulocytes 0.30 (H) 0.00 - 0.07 K/uL  TSH  Result Value Ref Range   TSH 2.622 0.350 - 4.500 uIU/mL  CK  Result Value Ref Range   Total  CK 31 (L) 49 - 397 U/L  Folate  Result Value Ref Range   Folate 14.2 >5.9 ng/mL  Vitamin B12  Result Value Ref Range   Vitamin B-12 555 180 - 914 pg/mL  Procalcitonin - Baseline  Result Value Ref Range   Procalcitonin 0.90 ng/mL  Comprehensive metabolic panel  Result Value Ref Range   Sodium 136 135 - 145 mmol/L   Potassium 3.3 (L) 3.5 - 5.1 mmol/L   Chloride 98 98 - 111 mmol/L   CO2 28 22 - 32 mmol/L   Glucose, Bld 136 (H) 70 - 99 mg/dL   BUN 19 8 - 23 mg/dL   Creatinine, Ser 0.86 0.61 - 1.24 mg/dL   Calcium 9.6 8.9 - 10.3 mg/dL   Total Protein 6.5 6.5 - 8.1 g/dL   Albumin 3.1 (L) 3.5 - 5.0 g/dL   AST 23 15 - 41 U/L   ALT 30 0 - 44 U/L   Alkaline Phosphatase 65 38 - 126 U/L   Total Bilirubin 0.7 0.3 - 1.2 mg/dL   GFR, Estimated >60 >60 mL/min   Anion gap 10 5 - 15  Magnesium  Result Value Ref Range   Magnesium 1.5 (L) 1.7 - 2.4 mg/dL  CBC with Differential/Platelet  Result Value Ref Range   WBC 13.2 (H) 4.0 - 10.5 K/uL   RBC 5.32 4.22 - 5.81 MIL/uL   Hemoglobin 16.1 13.0 - 17.0 g/dL   HCT 49.6 39.0 - 52.0 %   MCV 93.2 80.0 - 100.0 fL   MCH 30.3 26.0 - 34.0 pg   MCHC 32.5 30.0 - 36.0 g/dL   RDW 15.9 (H) 11.5 - 15.5 %   Platelets 166 150 - 400 K/uL   nRBC 0.0 0.0 - 0.2 %   Neutrophils Relative % 76 %   Neutro  Abs 10.2 (H) 1.7 - 7.7 K/uL   Lymphocytes Relative 14 %   Lymphs Abs 1.8 0.7 - 4.0 K/uL   Monocytes Relative 7 %   Monocytes Absolute 1.0 0.1 - 1.0 K/uL   Eosinophils Relative 0 %   Eosinophils Absolute 0.0 0.0 - 0.5 K/uL   Basophils Relative 1 %   Basophils Absolute 0.1 0.0 - 0.1 K/uL   Immature Granulocytes 2 %   Abs Immature Granulocytes 0.21 (H) 0.00 - 0.07 K/uL  Troponin I (High Sensitivity)  Result Value Ref Range   Troponin I (High Sensitivity) 8 <18 ng/L     C. Wynetta Emery, MD [facility]   09/13/2022 10:08 PM How to contact the [facility] Attending or Consulting provider [place] or covering provider during after hours [place], for this patient?  Check the care team in [facility] and look for a) attending/consulting [facility] provider listed and b) the [facility] team listed Log into [email or web address] and use [place]'s universal password to access. If you do not have the password, please contact the hospital operator. Locate the [facility] provider you are looking for under [facility] and page to a number that you can be directly reached. If you still have difficulty reaching the provider, please page the [facility] (Director on Call) for the Hospitalists listed on amion for assistance.

## 2022-09-14 NOTE — Assessment & Plan Note (Signed)
-   Chronic left lower extremity weakness

## 2022-09-14 NOTE — ED Provider Notes (Signed)
Care assumed from Dr. Alvino Chapel, patient with worsening weakness, unremarkable labs except for mild leukocytosis. He is pending urinalysis. He prefers to be discharged.  Urinalysis shows no evidence of infection.  I do note moderate hemoglobin with only 0-5 RBCs.  I do wonder if he might have some component of myositis contributing to his weakness.  I have ordered a CK level.  I have gone into discussed the findings with the patient and he still prefers to go home but his daughter is concerned that it is taking multiple people to be able to get him up and she wishes to have a more in-depth investigation as to why he is having worsening weakness and to see if anything can be done.  Patient is now asking to be admitted.  I have put in a consult request for the hospitalist.  I have discussed the case with Dr. Clearence Ped of [facility], who agrees to admit the patient.   Delora Fuel, MD 10/93/23 [phone]

## 2022-09-14 NOTE — Assessment & Plan Note (Signed)
Continue Crestor 

## 2022-09-14 NOTE — Assessment & Plan Note (Signed)
Continue metoprolol. 

## 2022-09-14 NOTE — TOC Progression Note (Signed)
Transition of Care [facility]) - Progression Note    Patient Details  Name: Charan Prieto MRN: [id number] Date of Birth: Apr 22, 1946  Transition of Care [facility]) CM/SW Contact  Salome Arnt, [place] Phone Number: 09/14/2022, 3:22 PM  Clinical Narrative:   Transition of Care [facility]) Screening Note   Patient Details  Name: Dathan Attia Date of Birth: 12/04/1945   Transition of Care [facility]) CM/SW Contact:    Salome Arnt, LCSW Phone Number: 09/14/2022, 3:23 PM  PT evaluation pending. TOC will follow for recommendations.   Transition of Care Department [facility]) has reviewed patient and no TOC needs have been identified at this time. We will continue to monitor patient advancement through interdisciplinary progression rounds. If new patient transition needs arise, please place a TOC consult.            Expected Discharge Plan and Services                                               Social Determinants of Health (SDOH) Interventions SDOH Screenings   Tobacco Use: Medium Risk (09/13/2022)    Readmission Risk Interventions     No data to display

## 2022-09-14 NOTE — Plan of Care (Signed)
  Problem: Acute Rehab PT Goals(only PT should resolve) Goal: Pt Will Go Supine/Side To Sit Outcome: Progressing Flowsheets (Taken 09/14/2022 1608) Pt will go Supine/Side to Sit:  with minimal assist  with moderate assist Goal: Patient Will Transfer Sit To/From Stand Outcome: Progressing Flowsheets (Taken 09/14/2022 1608) Patient will transfer sit to/from stand:  with minimal assist  with moderate assist Goal: Pt Will Transfer Bed To Chair/Chair To Bed Outcome: Progressing Flowsheets (Taken 09/14/2022 1608) Pt will Transfer Bed to Chair/Chair to Bed:  with min assist  with mod assist Goal: Pt Will Ambulate Outcome: Progressing Flowsheets (Taken 09/14/2022 1608) Pt will Ambulate:  15 feet  with minimal assist  with moderate assist  with rolling walker   4:09 PM, 09/14/22 Lonell Grandchild, MPT Physical Therapist with [facility] 336 [phone] office [phone] mobile phone

## 2022-09-14 NOTE — ED Notes (Signed)
Pt was given breakfast tray 

## 2022-09-14 NOTE — Assessment & Plan Note (Signed)
-   Normally has weakness of the left lower extremity, today generalized weakness requiring the assistance of 3 people to help him get up - May need placement if daughter is not able to take care of him at home with this weakness - Troponin normal - CT head shows no acute abnormality - Chest x-ray shows nothing acute - EKG shows known A-fib with a heart rate of 99, QTc 451 - CPK pending to rule out a myositis - TSH pending - Labs for nutritional deficiencies pending - PT eval and treat - Continue to monitor

## 2022-09-14 NOTE — TOC Initial Note (Signed)
Transition of Care [facility]) - Initial/Assessment Note    Patient Details  Name: Alexander Johnson MRN: [id number] Date of Birth: Apr 29, 1946  Transition of Care [facility]) CM/SW Contact:    Alexander Johnson, [place] Phone Number: 09/14/2022, 3:49 PM  Clinical Narrative:  Pt admitted due to generalized weakness. Assessment completed with pt's daughter, Alexander Johnson who reports pt lives with his wife. Alexander Johnson is very involved and assists at home. Pt spends most of this time in his chair and sleeps in recliner. He has been very weak since Thanksgiving. PT evaluated pt and recommend SNF. LCSW discussed with Alexander Johnson that pt is currently in obs and would need qualifying 3 midnight inpatient stay. Pt does not have Medicaid or long term care policy. Discussed private pay rates and Alexander Johnson plans to talk to pt and pt's wife tonight about whether this would be an option. She is aware home health can be arranged. TOC will follow up in AM.                  Expected Discharge Plan:  (unsure) Barriers to Discharge: Continued Medical Work up   Patient Goals and CMS Choice Patient states their goals for this hospitalization and ongoing recovery are:: unsure at this time- would like rehab   Choice offered to / list presented to : Adult Children      Expected Discharge Plan and Services In-house Referral: Clinical Social Work     Living arrangements for the past 2 months: [place]                                      Prior Living Arrangements/Services Living arrangements for the past 2 months: [place] Lives with:: Spouse Patient language and need for interpreter reviewed:: Yes        Need for Family Participation in Patient Care: Yes (Comment)   Current home services: DME (rollator, shower chair, BSC, cpap) Criminal Activity/Legal Involvement Pertinent to Current Situation/Hospitalization: No - Comment as needed  Activities of Daily Living      Permission Sought/Granted                   Emotional Assessment         Alcohol / Substance Use: Not Applicable Psych Involvement: No (comment)  Admission diagnosis:  Generalized weakness [R53.1] Patient Active Problem List   Diagnosis Date Noted   Generalized weakness 09/14/2022   Atrial fibrillation ([place]) 11/06/2021   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   HTN (hypertension) 11/06/2021   COPD (chronic obstructive pulmonary disease) ([place]) 11/06/2021   OSA on CPAP 11/06/2021   DM (diabetes mellitus) ([place]) 11/06/2021   Hyperlipidemia 11/06/2021   Hepatic cirrhosis ([place]) 11/06/2021   PCP:  Clinton Quant, MD Pharmacy:   [facility] [place], [address] [place] [address] Phone: [phone] Fax: [phone]  [facility] [place], [address] [place] [address] Phone: [phone] Fax: [phone]     Social Determinants of Health (SDOH) Social History: SDOH Screenings   Tobacco Use: Medium Risk (09/13/2022)   SDOH Interventions:     Readmission Risk Interventions     No data to display

## 2022-09-14 NOTE — H&P (Signed)
History and Physical    Patient: Alexander Johnson MWU:[id number] DOB: 06/26/1946 DOA: 09/13/2022 DOS: the patient was seen and examined on 09/14/2022 PCP: Pomposini, Cherly Anderson, MD  Patient coming from: Home  Chief Complaint:  Chief Complaint  Patient presents with   Weakness   HPI: Alexander Johnson is a 77 y.o. 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  Patient himself is a poor historian, daughter helps a lot.  They report that he has been weak from head to toe.  He has not had more weakness on one side versus than the other.  They report the onset was around Thanksgiving is been progressively worse since then.  He also had a flare of his rheumatoid arthritis 2 weeks ago.  He has been taking Plaquenil and prednisone taper for that.  He had a decreased appetite since the week before Christmas.  He had multiple falls.  They came in today because he had another fall.  He was walking when his legs just gave out.  He denies hitting his head, losing consciousness.  He denies any chest pain, palpitations beforehand.  He is on Xarelto and his last dose was in the morning.  No new symptoms.  His right leg is also weak which was not the case previously.  And may require at least short-term placement.  Patient does not smoke, does not drink.  He is not vaccinated for flu.  H he has had 2 COVID vaccines.  Patient is full code. Review of Systems: As mentioned in the history of present illness. All other systems reviewed and are negative. Past Medical History:  Diagnosis Date   A-fib ([place])    COPD (chronic obstructive pulmonary disease) (HCC)    High cholesterol    Hypertension    Polio    Past Surgical History:  Procedure Laterality Date   right knee replacement     rotator cuff surgery Left    Social History:  reports that he has  quit smoking. His smoking use included cigarettes. He has never used smokeless tobacco. He reports that he does not currently use alcohol. He reports that he does not use drugs.  Allergies  Allergen Reactions   Metformin Diarrhea    Family History  Problem Relation Age of Onset   Dementia Mother    Hypertension Mother    Hypertension Father    Heart attack Father    Colon polyps Neg Hx    Colon cancer Neg Hx     Prior to Admission medications   Medication Sig Start Date End Date Taking? Authorizing Provider  albuterol (VENTOLIN HFA) 108 (90 Base) MCG/ACT inhaler Inhale into the lungs.    [provider]  Cholecalciferol 50 MCG (2000 UT) CAPS Take 1 capsule by mouth daily.    [provider]  citalopram (CELEXA) 20 MG tablet Take 20 mg by mouth daily. 04/24/12   [provider]  docusate sodium (COLACE) 100 MG capsule Take 100 mg by mouth daily.    [provider]  fluticasone (FLONASE) 50 MCG/ACT nasal spray Place 2 sprays into both nostrils daily as needed for allergies. 05/02/12   [provider]  glimepiride (AMARYL) 2 MG tablet Take 2 mg by mouth daily with breakfast.    [provider]  metoprolol tartrate (LOPRESSOR) 100 MG tablet Take 100 mg by mouth 2 (two) times daily.  [provider]  Misc Natural Products (NEURIVA PO) Take 1 tablet by mouth daily.    [provider]  mupirocin ointment (BACTROBAN) 2 % Apply topically daily. 03/26/21   [provider]  pantoprazole (PROTONIX) 40 MG tablet Take 40 mg by mouth daily. 08/06/14   [provider]  rivaroxaban (XARELTO) 20 MG TABS tablet Take 20 mg by mouth daily.    [provider]  rosuvastatin (CRESTOR) 10 MG tablet Take 10 mg by mouth daily.    [provider]  SYMBICORT 160-4.5 MCG/ACT inhaler Inhale 2 puffs into the lungs 2 (two) times daily. 02/26/21   [provider]  Vibegron (GEMTESA) 75 MG TABS Take 1  tablet by mouth daily.    [provider]    Physical Exam: Vitals:   09/14/22 0030 09/14/22 0130 09/14/22 0230 09/14/22 0315  BP: (!) 112/92 114/81 (!) 114/90 (!) 110/98  Pulse: (!) 103 (!) 49 93 95  Resp: (!) 24 (!) 21 (!) 22 (!) 23  Temp:      TempSrc:      SpO2: 98% 92% 97% 94%  Weight:      Height:       1.  General: Patient lying supine in bed,  no acute distress   2. Psychiatric: Alert and oriented x 3, mood and behavior normal for situation, pleasant and cooperative with exam   3. Neurologic: Speech and language are normal, face is symmetric, moves all 4 extremities voluntarily, at baseline without acute deficits on limited exam   4. HEENMT:  Head is atraumatic, normocephalic, pupils reactive to light, neck is supple, trachea is midline, mucous membranes are moist   5. Respiratory : Lungs are clear to auscultation bilaterally without wheezing, rhonchi, rales, no cyanosis, no increase in work of breathing or accessory muscle use   6. Cardiovascular : Heart rate normal, rhythm is regular, no murmurs, rubs or gallops, no peripheral edema, peripheral pulses palpated   7. Gastrointestinal:  Abdomen is soft, nondistended, nontender to palpation bowel sounds active, no masses or organomegaly palpated   8. Skin:  Skin is warm, dry and intact without rashes, acute lesions, or ulcers on limited exam   9.Musculoskeletal:  No acute deformities or trauma, atrophy of left lower extremity due to polio, no peripheral edema, peripheral pulses palpated, no tenderness to palpation in the extremities  Data Reviewed: In the ED Temp 98.2, heart rate 37-23, respiratory rate 12-mid 20s, blood pressure 97/74-224/198 patient's blood pressure was 150 systolic at time of admission CT head shows no acute intracranial abnormality Chest x-ray shows nothing acute UA not indicative of UTI EKG shows atrial fibs Patient is a leukocytosis of 14.3, hemoglobin 17.0 Chemistry is  unremarkable Admission requested for generalized weakness  Assessment and Plan: * Generalized weakness - Normally has weakness of the left lower extremity, today generalized weakness requiring the assistance of 3 people to help him get up - May need placement if daughter is not able to take care of him at home with this weakness - Troponin normal - CT head shows no acute abnormality - Chest x-ray shows nothing acute - EKG shows known A-fib with a heart rate of 99, QTc 451 - CPK pending to rule out a myositis - TSH pending - Labs for nutritional deficiencies pending - PT eval and treat - Continue to monitor  Hyperlipidemia - Continue Crestor  OSA on CPAP - Continue CPAP  COPD (chronic obstructive pulmonary disease) (HCC) - Continue albuterol, Dulera  HTN (  hypertension) - Continue metoprolol  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 Observation status is judged to be reasonable and necessary in order to provide the required intensity of service to ensure the patient's safety. The patient's presenting symptoms, physical exam findings, and initial radiographic and laboratory data in the context of their medical condition is felt to place them at decreased risk for further clinical deterioration. Furthermore, it is anticipated that the patient will be medically stable for discharge from the hospital within 2 midnights of admission.   Author: Lilyan Gilford, DO 09/14/2022 3:32 AM  For on call review [email or web address].

## 2022-09-14 NOTE — Evaluation (Signed)
Physical Therapy Evaluation Patient Details Name: Alexander Johnson MRN: [id number] DOB: Sep 15, 1945 Today's Date: 09/14/2022  History of Present Illness  Alexander Johnson is a 77 y.o. 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  Patient himself is a poor historian, daughter helps a lot.  They report that he has been weak from head to toe.  He has not had more weakness on one side versus than the other.  They report the onset was around Thanksgiving is been progressively worse since then.  He also had a flare of his rheumatoid arthritis 2 weeks ago.  He has been taking Plaquenil and prednisone taper for that.  He had a decreased appetite since the week before Christmas.  He had multiple falls.  They came in today because he had another fall.  He was walking when his legs just gave out.  He denies hitting his head, losing consciousness.  He denies any chest pain, palpitations beforehand.  He is on Xarelto and his last dose was in the morning.  No new symptoms.  His right leg is also weak which was not the case previously.  And may require at least short-term placement.   Clinical Impression  Patient demonstrates slow labored movement for sitting up at bedside, very unsteady on feet and limited to a few side steps at bedside due to BLE weakness, c/o fatigue and fall risk.  Patient put back to bed with Mod assist to reposition.  Patient will benefit from continued skilled physical therapy in hospital and recommended venue below to increase strength, balance, endurance for safe ADLs and gait.         Recommendations for follow up therapy are one component of a multi-disciplinary discharge planning process, led by the attending physician.  Recommendations may be updated based on patient status, additional functional criteria and insurance  authorization.  Follow Up Recommendations Skilled nursing-short term rehab (<3 hours/day) Can patient physically be transported by private vehicle: No    Assistance Recommended at Discharge Set up Supervision/Assistance  Patient can return home with the following  A lot of help with walking and/or transfers;A lot of help with bathing/dressing/bathroom;Assistance with cooking/housework;Help with stairs or ramp for entrance    Equipment Recommendations None recommended by PT  Recommendations for Other Services       Functional Status Assessment Patient has had a recent decline in their functional status and demonstrates the ability to make significant improvements in function in a reasonable and predictable amount of time.     Precautions / Restrictions Precautions Precautions: Fall Restrictions Weight Bearing Restrictions: No      Mobility  Bed Mobility Overal bed mobility: Needs Assistance Bed Mobility: Supine to Sit, Sit to Supine     Supine to sit: Mod assist Sit to supine: Mod assist   General bed mobility comments: increased time, labored movement    Transfers Overall transfer level: Needs assistance Equipment used: Rolling walker (2 wheels) Transfers: Sit to/from Stand Sit to Stand: Mod assist           General transfer comment: slow labored movement    Ambulation/Gait Ambulation/Gait assistance: Mod assist Gait Distance (Feet): 3 Feet Assistive device: Rolling walker (2 wheels) Gait Pattern/deviations: Decreased step length - right, Decreased step length - left, Decreased stride length Gait velocity: slow     General Gait Details: limited to a few slow labored unsteady side steps  due to fatigue and BLE weakness  Stairs            Wheelchair Mobility    Modified Rankin (Stroke Patients Only)       Balance Overall balance assessment: Needs assistance Sitting-balance support: Feet unsupported, No upper extremity supported Sitting  balance-Leahy Scale: Fair Sitting balance - Comments: seated at EOB   Standing balance support: Reliant on assistive device for balance, During functional activity, Bilateral upper extremity supported Standing balance-Leahy Scale: Poor Standing balance comment: fair/poor using RW                             Pertinent Vitals/Pain Pain Assessment Pain Assessment: No/denies pain    Home Living Family/patient expects to be discharged to:: Private residence Living Arrangements: Spouse/significant other Available Help at Discharge: Family;Available PRN/intermittently Type of Home: House Home Access: Ramped entrance       Home Layout: One level Home Equipment: Rollator (4 wheels);Wheelchair - manual;Cane - single point      Prior Function Prior Level of Function : Needs assist       Physical Assist : Mobility (physical);ADLs (physical) Mobility (physical): Bed mobility;Transfers;Gait;Stairs ADLs (physical): Dressing Mobility Comments: household ambulator using Rollator ADLs Comments: assisted by family for dressing and community ADLs     Hand Dominance        Extremity/Trunk Assessment   Upper Extremity Assessment Upper Extremity Assessment: Generalized weakness    Lower Extremity Assessment Lower Extremity Assessment: Generalized weakness;LLE deficits/detail LLE Deficits / Details: grossly -3/5 LLE Sensation: WNL LLE Coordination: decreased fine motor    Cervical / Trunk Assessment Cervical / Trunk Assessment: Normal  Communication   Communication: No difficulties  Cognition Arousal/Alertness: Awake/alert Behavior During Therapy: WFL for tasks assessed/performed Overall Cognitive Status: Within Functional Limits for tasks assessed                                          General Comments      Exercises     Assessment/Plan    PT Assessment Patient needs continued PT services  PT Problem List Decreased strength;Decreased  activity tolerance;Decreased balance;Decreased mobility       PT Treatment Interventions DME instruction;Gait training;Stair training;Functional mobility training;Therapeutic activities;Therapeutic exercise;Balance training;Patient/family education    PT Goals (Current goals can be found in the Care Plan section)  Acute Rehab PT Goals Patient Stated Goal: return home after rehab PT Goal Formulation: With patient Time For Goal Achievement: 09/28/22 Potential to Achieve Goals: Good    Frequency Min 3X/week     Co-evaluation               AM-PAC PT "6 Clicks" Mobility  Outcome Measure Help needed turning from your back to your side while in a flat bed without using bedrails?: A Lot Help needed moving from lying on your back to sitting on the side of a flat bed without using bedrails?: A Lot Help needed moving to and from a bed to a chair (including a wheelchair)?: A Lot Help needed standing up from a chair using your arms (e.g., wheelchair or bedside chair)?: A Lot Help needed to walk in hospital room?: A Lot Help needed climbing 3-5 steps with a railing? : Total 6 Click Score: 11    End of Session   Activity Tolerance: Patient tolerated treatment well;Patient limited by fatigue Patient left:  in bed;with call bell/phone within reach Nurse Communication: Mobility status PT Visit Diagnosis: Unsteadiness on feet (R26.81);Other abnormalities of gait and mobility (R26.89);Muscle weakness (generalized) (M62.81)    Time: 3762-8315 PT Time Calculation (min) (ACUTE ONLY): 23 min   Charges:   PT Evaluation $PT Eval Moderate Complexity: 1 Mod PT Treatments $Therapeutic Activity: 23-37 mins        4:07 PM, 09/14/22 Ocie Bob, MPT Physical Therapist with [facility] [phone] office [phone] mobile phone

## 2022-09-14 NOTE — Assessment & Plan Note (Signed)
Continue CPAP.  

## 2022-09-14 NOTE — Assessment & Plan Note (Signed)
-   Continue albuterol, Ruthe Mannan

## 2022-09-15 DIAGNOSIS — N179 Acute kidney failure, unspecified: Secondary | ICD-10-CM | POA: Insufficient documentation

## 2022-09-15 DIAGNOSIS — I4891 Unspecified atrial fibrillation: Secondary | ICD-10-CM | POA: Diagnosis present

## 2022-09-15 DIAGNOSIS — R531 Weakness: Secondary | ICD-10-CM | POA: Diagnosis present

## 2022-09-15 DIAGNOSIS — L03119 Cellulitis of unspecified part of limb: Secondary | ICD-10-CM | POA: Diagnosis present

## 2022-09-15 DIAGNOSIS — Z8249 Family history of ischemic heart disease and other diseases of the circulatory system: Secondary | ICD-10-CM | POA: Diagnosis not present

## 2022-09-15 DIAGNOSIS — H15009 Unspecified scleritis, unspecified eye: Secondary | ICD-10-CM | POA: Diagnosis present

## 2022-09-15 DIAGNOSIS — L03115 Cellulitis of right lower limb: Secondary | ICD-10-CM

## 2022-09-15 DIAGNOSIS — Z7401 Bed confinement status: Secondary | ICD-10-CM | POA: Diagnosis not present

## 2022-09-15 DIAGNOSIS — Z9181 History of falling: Secondary | ICD-10-CM | POA: Diagnosis not present

## 2022-09-15 DIAGNOSIS — Z87891 Personal history of nicotine dependence: Secondary | ICD-10-CM | POA: Diagnosis not present

## 2022-09-15 DIAGNOSIS — L02415 Cutaneous abscess of right lower limb: Secondary | ICD-10-CM | POA: Insufficient documentation

## 2022-09-15 DIAGNOSIS — J449 Chronic obstructive pulmonary disease, unspecified: Secondary | ICD-10-CM | POA: Diagnosis present

## 2022-09-15 DIAGNOSIS — F05 Delirium due to known physiological condition: Secondary | ICD-10-CM | POA: Diagnosis present

## 2022-09-15 DIAGNOSIS — G14 Postpolio syndrome: Secondary | ICD-10-CM | POA: Diagnosis present

## 2022-09-15 DIAGNOSIS — R32 Unspecified urinary incontinence: Secondary | ICD-10-CM | POA: Diagnosis present

## 2022-09-15 DIAGNOSIS — R296 Repeated falls: Secondary | ICD-10-CM | POA: Diagnosis present

## 2022-09-15 DIAGNOSIS — M069 Rheumatoid arthritis, unspecified: Secondary | ICD-10-CM | POA: Diagnosis present

## 2022-09-15 DIAGNOSIS — E782 Mixed hyperlipidemia: Secondary | ICD-10-CM | POA: Diagnosis present

## 2022-09-15 DIAGNOSIS — Z96651 Presence of right artificial knee joint: Secondary | ICD-10-CM | POA: Diagnosis present

## 2022-09-15 DIAGNOSIS — E869 Volume depletion, unspecified: Secondary | ICD-10-CM | POA: Diagnosis not present

## 2022-09-15 DIAGNOSIS — I10 Essential (primary) hypertension: Secondary | ICD-10-CM | POA: Diagnosis present

## 2022-09-15 DIAGNOSIS — G4733 Obstructive sleep apnea (adult) (pediatric): Secondary | ICD-10-CM | POA: Diagnosis present

## 2022-09-15 DIAGNOSIS — Z1152 Encounter for screening for COVID-19: Secondary | ICD-10-CM | POA: Diagnosis not present

## 2022-09-15 DIAGNOSIS — Z7984 Long term (current) use of oral hypoglycemic drugs: Secondary | ICD-10-CM | POA: Diagnosis not present

## 2022-09-15 DIAGNOSIS — R627 Adult failure to thrive: Secondary | ICD-10-CM | POA: Insufficient documentation

## 2022-09-15 DIAGNOSIS — Z7901 Long term (current) use of anticoagulants: Secondary | ICD-10-CM | POA: Diagnosis not present

## 2022-09-15 LAB — RESP PANEL BY RT-PCR (RSV, FLU A&B, COVID)  RVPGX2
Influenza A by PCR: NEGATIVE
Influenza B by PCR: NEGATIVE
Resp Syncytial Virus by PCR: NEGATIVE
SARS Coronavirus 2 by RT PCR: NEGATIVE

## 2022-09-15 LAB — BASIC METABOLIC PANEL
Anion gap: 9 (ref 5–15)
BUN: 17 mg/dL (ref 8–23)
CO2: 30 mmol/L (ref 22–32)
Calcium: 9.7 mg/dL (ref 8.9–10.3)
Chloride: 98 mmol/L (ref 98–111)
Creatinine, Ser: 0.82 mg/dL (ref 0.61–1.24)
GFR, Estimated: >60 mL/min (ref >60)
Glucose, Bld: 134 mg/dL — ABNORMAL HIGH (ref 70–99)
Potassium: 3.9 mmol/L (ref 3.5–5.1)
Sodium: 137 mmol/L (ref 135–145)

## 2022-09-15 LAB — MAGNESIUM: Magnesium: 2.1 mg/dL (ref 1.7–2.4)

## 2022-09-15 MED ORDER — CEFAZOLIN SODIUM-DEXTROSE 1-4 GM/50ML-% IV SOLN
1.0000 g | Freq: Three times a day (TID) | INTRAVENOUS | Status: DC
  Administered 2022-09-15 – 2022-09-17 (×6): 1 g via INTRAVENOUS
  Filled 2022-09-15 (×10): qty 50

## 2022-09-15 MED ORDER — PREDNISONE 5 MG PO TABS
12.5000 mg | ORAL_TABLET | Freq: Every day | ORAL | Status: DC
  Administered 2022-09-16 – 2022-09-17 (×2): 12.5 mg via ORAL
  Filled 2022-09-15 (×2): qty 1

## 2022-09-15 MED ORDER — LACTATED RINGERS IV SOLN: INTRAVENOUS | Status: DC

## 2022-09-15 NOTE — Progress Notes (Addendum)
PROGRESS NOTE  Alexander Johnson OQH:[id number] DOB: [birth year] DOA: 09/13/2022 PCP: Clinton Quant, MD  Brief History:  77 year old male with a history of atrial fibrillation, COPD, hypertension, hyperlipidemia, polio with left lower extremity weakness presenting with generalized weakness.  The patient is a poor historian, but states that he is weaker than usual.  Daughter assists with the history.  She states that the patient has been progressively weaker since Thanksgiving.  He has had decreased oral intake since Christmas.  Because of the weakness, the patient has had multiple falls.  At baseline, the patient was able to ambulate with a walker.  However, his weakness has progressed to the point where he needed assistance of 3 people to get out of bed.  The patient has been falling but denies hitting his head or losing consciousness.  He denies any fevers, chills, chest pain, shortness breath, nausea, vomiting, diarrhea.  In the ED, the patient had WBC 14.3, hemoglobin 17.0, platelets 189,000.  Serum creatinine was 1.10.  The patient was started on IV fluids.  He was admitted for further evaluation and treatment of his generalized weakness.   Assessment/Plan: Cellulitis right lower extremity -Patient has failed outpatient oral antibiotics -Start IV cefazolin -This may be partly contributing to his worsening generalized weakness  Failure to thrive/generalized weakness -UA negative for pyuria -Folic acid 54.6 -F68---127 -TSH 2.622 -CPK 31 -PT evaluation>>SNF -start IVF  AKI -Secondary to volume depletion -Baseline creatinine 0.7-0.8 -Presented with serum creatinine 1.10 -Improved with IV fluid--continue  Mixed hyperlipidemia -Continue statin  Rheumatoid arthritis--seronegative -Continue Plaquenil  Essential hypertension -Continue metoprolol tartrate  Atrial fibrillation, type unspecified -Rate controlled -Continue metoprolol tartrate -Continue  rivaroxaban  Essential HTN -continue metoprolol tartrate   PT/OT evaluations have been performed.  SNF is recommended.  SNF is appropriate as the patient will receive 3 days of hospital care (or the 3 day period has bee waived by the patient's insurance company) and is felt to need rehab services to restore this patient to their prior level of function to achieve safe transition back to home care.  This patient needs rehab services for at least 5 days per week and skilled nursing services daily to facilitate this transition.  Rehab is being requested as the most appropriate discharge option for this patient and  NOT felt to be for custodial care as evidenced by his higher level of function and ability to perform his ADLs prior t admission.    Family Communication:   daughter updated 09/15/22  Consultants:  none  Code Status:  FULL  DVT Prophylaxis: Xarelto   Procedures: As Listed in Progress Note Above  Antibiotics: Cefazolin 1/10>>      Subjective: Patient complains of pain in the right leg.  He states that the erythema is worse than usual.  He denies any chest pain, shortness breath, coughing, hemoptysis, nausea, vomiting, diarrhea, abdominal pain.  He states he was recently on antibiotics but does not remember the name.  Objective: Vitals:   09/15/22 0010 09/15/22 0430 09/15/22 0726 09/15/22 0808  BP: (!) 104/90 92/71  126/83  Pulse: (!) 109 89  (!) 109  Resp: 18 16    Temp: (!) 97.5 F (36.4 C) (!) 97.4 F (36.3 C)    TempSrc: Axillary Oral    SpO2: 92% 91% 94%   Weight:      Height:        Intake/Output Summary (Last 24 hours) at 09/15/2022 1325 Last data  filed at 09/15/2022 0900 Gross per 24 hour  Intake 480 ml  Output 500 ml  Net -20 ml   Weight change:  Exam:  General:  Pt is alert, follows commands appropriately, not in acute distress HEENT: No icterus, No thrush, No neck mass, [place]/AT Cardiovascular: IRRR, S1/S2, no rubs, no gallops Respiratory:  bibasilar crackles. No wheeze Abdomen: soft/NT+BS Ext: Erythema of the right foot and lower leg extending from the toes to the upper third of the tibia and fibula.  There is no crepitance.  No purulent drainage.  Data Reviewed: I have personally reviewed following labs and imaging studies Basic Metabolic Panel: Recent Labs  Lab 09/13/22 2220 09/14/22 0520 09/15/22 0338  NA 135 136 137  K 3.6 3.3* 3.9  CL 96* 98 98  CO2 24 28 30   GLUCOSE 197* 136* 134*  BUN 22 19 17   CREATININE 1.10 0.86 0.82  CALCIUM 9.7 9.6 9.7  MG  --  1.5* 2.1   Liver Function Tests: Recent Labs  Lab 09/13/22 2220 09/14/22 0520  AST 33 23  ALT 32 30  ALKPHOS 69 65  BILITOT 0.8 0.7  PROT 6.6 6.5  ALBUMIN 3.3* 3.1*   No results for input(s): "LIPASE", "AMYLASE" in the last 168 hours. No results for input(s): "AMMONIA" in the last 168 hours. Coagulation Profile: No results for input(s): "INR", "PROTIME" in the last 168 hours. CBC: Recent Labs  Lab 09/13/22 2220 09/14/22 0520  WBC 14.3* 13.2*  NEUTROABS 11.6* 10.2*  HGB 17.0 16.1  HCT 52.6* 49.6  MCV 93.9 93.2  PLT 189 166   Cardiac Enzymes: Recent Labs  Lab 09/13/22 2220  CKTOTAL 31*   BNP: Invalid input(s): "POCBNP" CBG: No results for input(s): "GLUCAP" in the last 168 hours. HbA1C: No results for input(s): "HGBA1C" in the last 72 hours. Urine analysis:    Component Value Date/Time   COLORURINE YELLOW 09/14/2022 0045   APPEARANCEUR CLEAR 09/14/2022 0045   LABSPEC 1.017 09/14/2022 0045   PHURINE 5.0 09/14/2022 0045   GLUCOSEU >=500 (A) 09/14/2022 0045   HGBUR MODERATE (A) 09/14/2022 0045   BILIRUBINUR NEGATIVE 09/14/2022 0045   KETONESUR NEGATIVE 09/14/2022 0045   PROTEINUR NEGATIVE 09/14/2022 0045   NITRITE NEGATIVE 09/14/2022 0045   LEUKOCYTESUR NEGATIVE 09/14/2022 0045   Sepsis Labs: @LABRCNTIP (procalcitonin:4,lacticidven:4) ) Recent Results (from the past 240 hour(s))  Urine Culture     Status: Abnormal  (Preliminary result)   Collection Time: 09/14/22 12:45 AM   Specimen: Urine, Clean Catch  Result Value Ref Range Status   Specimen Description   Final    URINE, CLEAN CATCH Performed at [facility], [address] [place], [place] [address]    Special Requests   Final    NONE Performed at [facility], [address] [place], [place] [address]    Culture (A)  Final    20,000 COLONIES/mL ENTEROCOCCUS FAECALIS SUSCEPTIBILITIES TO FOLLOW Performed at [facility], [place] [address] [place],  [address]    Report Status PENDING  Incomplete     Scheduled Meds:  citalopram  20 mg Oral Daily   metoprolol tartrate  100 mg Oral BID   mometasone-formoterol  2 puff Inhalation BID   pantoprazole  40 mg Oral Daily   rivaroxaban  20 mg Oral Daily   rosuvastatin  10 mg Oral Daily   Continuous Infusions:  Procedures/Studies: US Venous Img Lower Unilateral Right (DVT)  Result Date: 09/14/2022 CLINICAL DATA:  Edema EXAM: RIGHT LOWER EXTREMITY VENOUS DOPPLER ULTRASOUND  TECHNIQUE: Gray-scale sonography with compression, as well as color and duplex ultrasound, were performed to evaluate the deep venous system(s) from the level of the common femoral vein through the popliteal and proximal calf veins. COMPARISON:  None Available. FINDINGS: VENOUS Normal compressibility of the common femoral, superficial femoral, and popliteal veins, as well as the visualized calf veins. Visualized portions of profunda femoral vein and great saphenous vein unremarkable. No filling defects to suggest DVT on grayscale or color Doppler imaging. Doppler waveforms show normal direction of venous flow, normal respiratory plasticity and response to augmentation. Limited views of the contralateral common femoral vein are unremarkable. OTHER None. Limitations: none IMPRESSION: No evidence of DVT in the right lower extremity. Electronically Signed   By: Caprice Renshaw M.D.   On: 09/14/2022 09:07   CT Head Wo  Contrast  Result Date: 09/13/2022 CLINICAL DATA:  Neuro deficit, acute, stroke suspected EXAM: CT HEAD WITHOUT CONTRAST TECHNIQUE: Contiguous axial images were obtained from the base of the skull through the vertex without intravenous contrast. RADIATION DOSE REDUCTION: This exam was performed according to the departmental dose-optimization program which includes automated exposure control, adjustment of the mA and/or kV according to patient size and/or use of iterative reconstruction technique. COMPARISON:  None Available. FINDINGS: Brain: Cerebral ventricle sizes are concordant with the degree of cerebral volume loss. No evidence of large-territorial acute infarction. No parenchymal hemorrhage. No mass lesion. No extra-axial collection. No mass effect or midline shift. No hydrocephalus. Basilar cisterns are patent. Vascular: No hyperdense vessel. Skull: No acute fracture or focal lesion. Sinuses/Orbits: Paranasal sinuses and mastoid air cells are clear. The orbits are unremarkable. Other: None. IMPRESSION: No acute intracranial abnormality. Electronically Signed   By: Tish Frederickson M.D.   On: 09/13/2022 23:42   DG Chest Portable 1 View  Result Date: 09/13/2022 CLINICAL DATA:  Weakness and multiple falls since Thanksgiving EXAM: PORTABLE CHEST 1 VIEW COMPARISON:  07/28/2021 FINDINGS: Cardiomegaly. Aortic atherosclerotic calcification. No focal consolidation, pleural effusion, or pneumothorax. No acute osseous abnormality. IMPRESSION: No active disease.  Cardiomegaly. Electronically Signed   By: Minerva Fester M.D.   On: 09/13/2022 23:37    Catarina Hartshorn, DO  [facility]  If 7PM-7AM, please contact night-coverage [email or web address] Password [email or web address] 09/15/2022, 1:25 PM   LOS: 0 days

## 2022-09-15 NOTE — Progress Notes (Signed)
  Patient Information  Patient Name Murrel, Bertram ([id number]) Legal Sex Male DOB 05-31-1946  Room Bed  A324 A324-01    Patient Demographics  Address [place] [address] Phone [phone] (Home) [phone] (Mobile)    Basic Information  Date Of Birth Apr 26, 1946 Legal Sex Male Race White or Caucasian Ethnic Group Not Hispanic or Latino Preferred Language English    Patient Contacts  Name Relation Home Work Mobile  D[place],Stacey Daughter   [phone]  Fredirick Maudlin Daughter   [phone]    Documents on File   Status Date Received Description  Documents for the Patient  [place] Not Received    [place] E-Signature HIPAA Notice of Privacy Signed 23/76/28   Driver's License Received 31/51/76 DL  Insurance Card Received 07/28/21 Medicare 2022  Other Photo ID Not Received    Insurance Card Received 07/28/21 [facility] 2022  Patient Photo   Photo of Patient  Documents for the [employer] [facility] E-sig Signed 09/13/22   AOB  (Assignment of Insurance Benefits) Not Received    E-signature AOB Signed 09/14/22   MEDICARE RIGHTS Received 09/14/22 vc by Iver Nestle, daughter 09/14/2022 3:19am  E-signature Medicare Rights     Annual Exam - E-Signature PCP     ED Patient Billing Extract   ED PB Billing Extract  Medicare Observation  09/15/22   EKG Received 09/15/22     Admission Information   Current Information  Attending Provider Admitting Provider Admission Type Admission Status  Tat, Shanon Brow, MD Orson Eva, MD Emergency Confirmed Admission        Admission Date/Time Discharge Date Hospital Service Auth/Cert Status  16/07/37  [facility] Room/Bed   [facility] AP-DEPT 300 [phone]             Admission  Complaint  weakness    Hospital Account  Name Acct ID Class Status Primary Coverage  Oluwaseyi, Raffel  [id number] Inpatient Open MEDICARE - MEDICARE PART A AND B        Guarantor Account (for Normal [id number])  Name Relation to Pt Service Area Active? Acct Type  Enzo Montgomery Self CHSA Yes Personal/Family  Address Phone    [address] [place] [address] [phone])          Coverage Information (for Hospital Account [id number])   1. MEDICARE/MEDICARE PART A AND B  F/O Payor/Plan Precert #  MEDICARE/MEDICARE PART A AND B   Subscriber Subscriber #  Shammond, Arave [id number]  Address Phone  [address] [place] [place], [place] [address]    2. GENERIC COMMERCIAL/GENERIC COMMERCIAL  F/O Payor/Plan Precert #  [facility] COMMERCIAL/GENERIC COMMERCIAL   Subscriber Subscriber #  Stephanie, [place] [id number]  [place] [address] [phone]          Care Everywhere ID:  [id number]

## 2022-09-15 NOTE — Care Management Obs Status (Signed)
[place] NOTIFICATION   Patient Details  Name: Alexander Johnson MRN: [id number] Date of Birth: 1946-05-01   Medicare Observation Status Notification Given:  Yes (copy mailed to address on file as requested)    Tommy Medal 09/15/2022, 10:16 AM

## 2022-09-15 NOTE — TOC Progression Note (Signed)
Transition of Care [facility]) - Progression Note    Patient Details  Name: Alexander Johnson MRN: [id number] Date of Birth: [birth year]  Transition of Care [facility]) CM/SW Contact  Salome Arnt, [place] Phone Number: 09/15/2022, 8:31 AM  Clinical Narrative: LCSW followed up with pt's daughter, Alexander Johnson who reports she spoke with pt and pt's wife last night about d/c options. They have decided to have pt return home with home health. Family requests [facility]. Referred and accepted by Santiago Glad with [facility] for HHPT, OT, RN, and SW. Pt has a private duty caregiver 3 days a week from 10-2.       Expected Discharge Plan:  (unsure) Barriers to Discharge: Continued Medical Work up  Expected Discharge Plan and Services In-house Referral: Clinical Social Work     Living arrangements for the past 2 months: [place]: [place] Date [place]: 09/15/22 Time [place]: [phone] Representative spoke with at [place]: [place] Determinants of Health ([place]) Interventions SDOH Screenings   Food Insecurity: No Food Insecurity (09/14/2022)  Housing: Low Risk  (09/14/2022)  Transportation Needs: No Transportation Needs (09/14/2022)  Utilities: Not At Risk (09/14/2022)  Tobacco Use: Medium Risk (09/13/2022)    Readmission Risk Interventions     No data to display

## 2022-09-15 NOTE — TOC Progression Note (Addendum)
Transition of Care [facility]) - Progression Note    Patient Details  Name: Alexander Johnson MRN: [id number] Date of Birth: 01-Oct-1945  Transition of Care [facility]) CM/SW Contact  Salome Arnt, [place] Phone Number: 09/15/2022, 2:27 PM  Clinical Narrative: Pt now inpatient. Discussed with daughter and she requests [name] or [name]. Referral sent. Santiago Glad with [facility] notified of d/c plan.     Expected Discharge Plan:  (unsure) Barriers to Discharge: Continued Medical Work up  Expected Discharge Plan and Services In-house Referral: Clinical Social Work     Living arrangements for the past 2 months: [place]: RN, PT, OT, Social Work [employer] Agency: [place] Date [place]: 09/15/22 Time [place]: 1884 Representative spoke with at [place]: [place] ([place]) Interventions SDOH Screenings   Food Insecurity: No Food Insecurity (09/14/2022)  Housing: Low Risk  (09/14/2022)  Transportation Needs: No Transportation Needs (09/14/2022)  Utilities: Not At Risk (09/14/2022)  Tobacco Use: Medium Risk (09/13/2022)    Readmission Risk Interventions     No data to display

## 2022-09-15 NOTE — Hospital Course (Addendum)
77 year old male with a history of atrial fibrillation, COPD, hypertension, hyperlipidemia, polio with left lower extremity weakness presenting with generalized weakness.  The patient is a poor historian, but states that he is weaker than usual.  Daughter assists with the history.  She states that the patient has been progressively weaker since Thanksgiving.  He has had decreased oral intake since Christmas.  Because of the weakness, the patient has had multiple falls.  At baseline, the patient was able to ambulate with a walker.  However, his weakness has progressed to the point where he needed assistance of 3 people to get out of bed.  The patient has been falling but denies hitting his head or losing consciousness.  He denies any fevers, chills, chest pain, shortness breath, nausea, vomiting, diarrhea.  In the ED, the patient had WBC 14.3, hemoglobin 17.0, platelets 189,000.  Serum creatinine was 1.10.  The patient was started on IV fluids.  He was admitted for further evaluation and treatment of his generalized weakness. He was treated for UTI and cellulitis with clinical improvement.  He continued to have generalized weakness and remained deconditioned.  PT recommended SNF.  His discharge was delayed due to family preference for SNF and [facility] team logistical issues.

## 2022-09-16 ENCOUNTER — Inpatient Hospital Stay (HOSPITAL_COMMUNITY): Payer: Medicare Other

## 2022-09-16 DIAGNOSIS — N179 Acute kidney failure, unspecified: Secondary | ICD-10-CM | POA: Diagnosis not present

## 2022-09-16 DIAGNOSIS — R531 Weakness: Secondary | ICD-10-CM | POA: Diagnosis not present

## 2022-09-16 DIAGNOSIS — L03115 Cellulitis of right lower limb: Secondary | ICD-10-CM | POA: Diagnosis not present

## 2022-09-16 DIAGNOSIS — I4891 Unspecified atrial fibrillation: Secondary | ICD-10-CM | POA: Diagnosis not present

## 2022-09-16 LAB — MAGNESIUM: Magnesium: 1.7 mg/dL (ref 1.7–2.4)

## 2022-09-16 LAB — URINE CULTURE: Culture: 20000 — AB

## 2022-09-16 LAB — CBC
HCT: 47.1 % (ref 39.0–52.0)
Hemoglobin: 14.8 g/dL (ref 13.0–17.0)
MCH: 30 pg (ref 26.0–34.0)
MCHC: 31.4 g/dL (ref 30.0–36.0)
MCV: 95.5 fL (ref 80.0–100.0)
Platelets: 152 10*3/uL (ref 150–400)
RBC: 4.93 MIL/uL (ref 4.22–5.81)
RDW: 16.2 % — ABNORMAL HIGH (ref 11.5–15.5)
WBC: 11.3 10*3/uL — ABNORMAL HIGH (ref 4.0–10.5)
nRBC: 0 % (ref 0.0–0.2)

## 2022-09-16 LAB — BASIC METABOLIC PANEL
Anion gap: 10 (ref 5–15)
BUN: 14 mg/dL (ref 8–23)
CO2: 26 mmol/L (ref 22–32)
Calcium: 8.9 mg/dL (ref 8.9–10.3)
Chloride: 99 mmol/L (ref 98–111)
Creatinine, Ser: 0.76 mg/dL (ref 0.61–1.24)
GFR, Estimated: >60 mL/min (ref >60)
Glucose, Bld: 112 mg/dL — ABNORMAL HIGH (ref 70–99)
Potassium: 3.8 mmol/L (ref 3.5–5.1)
Sodium: 135 mmol/L (ref 135–145)

## 2022-09-16 MED ORDER — MAGNESIUM OXIDE -MG SUPPLEMENT 400 (240 MG) MG PO TABS
400.0000 mg | ORAL_TABLET | Freq: Every day | ORAL | Status: DC
  Administered 2022-09-16 – 2022-09-20 (×5): 400 mg via ORAL
  Filled 2022-09-16 (×5): qty 1

## 2022-09-16 NOTE — Progress Notes (Addendum)
PROGRESS NOTE  Alexander Johnson MEQ:[id number] DOB: Dec 17, 1945 DOA: 09/13/2022 PCP: Clinton Quant, MD  Brief History:  77 year old male with a history of atrial fibrillation, COPD, hypertension, hyperlipidemia, polio with left lower extremity weakness presenting with generalized weakness.  The patient is a poor historian, but states that he is weaker than usual.  Daughter assists with the history.  She states that the patient has been progressively weaker since Thanksgiving.  He has had decreased oral intake since Christmas.  Because of the weakness, the patient has had multiple falls.  At baseline, the patient was able to ambulate with a walker.  However, his weakness has progressed to the point where he needed assistance of 3 people to get out of bed.  The patient has been falling but denies hitting his head or losing consciousness.  He denies any fevers, chills, chest pain, shortness breath, nausea, vomiting, diarrhea.  In the ED, the patient had WBC 14.3, hemoglobin 17.0, platelets 189,000.  Serum creatinine was 1.10.  The patient was started on IV fluids.  He was admitted for further evaluation and treatment of his generalized weakness.   Assessment/Plan: Cellulitis right lower extremity -Patient has failed outpatient oral antibiotics -continue IV cefazolin -This may be partly contributing to his worsening generalized weakness   Failure to thrive/generalized weakness -UA negative for pyuria -Folic acid 29.7 -L89---211 -TSH 2.622 -CPK 31 -PT evaluation>>SNF -continue IVF -MR brain   AKI -Secondary to volume depletion -Baseline creatinine 0.7-0.8 -Presented with serum creatinine 1.10 -Improved with IV fluid--continue   Mixed hyperlipidemia -Continue statin   Rheumatoid arthritis--seronegative -Continue Plaquenil   Essential hypertension -Continue metoprolol tartrate   Atrial fibrillation, type unspecified -Rate controlled -Continue metoprolol  tartrate -Continue rivaroxaban   Essential HTN -continue metoprolol tartrate   Family Communication:   daughter updated 09/16/22   Consultants:  none   Code Status:  FULL   DVT Prophylaxis: Xarelto     Procedures: As Listed in Progress Note Above   Antibiotics: Cefazolin 1/10>>        Subjective: Pt is feeling a little stronger.  Patient denies fevers, chills, headache, chest pain, dyspnea, nausea, vomiting, diarrhea, abdominal pain, dysuria, hematuria, hematochezia, and melena.   Objective: Vitals:   09/15/22 2342 09/16/22 0820 09/16/22 0835 09/16/22 1443  BP:  115/73  104/81  Pulse: 65 92 91 88  Resp: 16  16 16   Temp:    98.1 F (36.7 C)  TempSrc:      SpO2: 93%  95% 94%  Weight:      Height:        Intake/Output Summary (Last 24 hours) at 09/16/2022 1642 Last data filed at 09/16/2022 1523 Gross per 24 hour  Intake 360 ml  Output 1900 ml  Net -1540 ml   Weight change:  Exam:  General:  Pt is alert, follows commands appropriately, not in acute distress HEENT: No icterus, No thrush, No neck mass, [place]/AT Cardiovascular: IRRR, S1/S2, no rubs, no gallops Respiratory: bibasilar crackles. No wheeze Abdomen: Soft/+BS, non tender, non distended, no guarding Extremities: +erythema of Right leg from forefoot to the upper calf without purulent drainage or necrosis Neuro:  CN II-XII intact, strength 4-/5 in RUE, RLE, strength 4-/5 LUE, LLE; sensation intact bilateral; no dysmetria; babinski equivocal +DTR of bilateral knee    Data Reviewed: I have personally reviewed following labs and imaging studies Basic Metabolic Panel: Recent Labs  Lab 09/13/22 2220 09/14/22 0520 09/15/22 0338 09/16/22 0710  NA 135 136 137 135  K 3.6 3.3* 3.9 3.8  CL 96* 98 98 99  CO2 24 28 30 26   GLUCOSE 197* 136* 134* 112*  BUN 22 19 17 14   CREATININE 1.10 0.86 0.82 0.76  CALCIUM 9.7 9.6 9.7 8.9  MG  --  1.5* 2.1 1.7   Liver Function Tests: Recent Labs  Lab 09/13/22 2220  09/14/22 0520  AST 33 23  ALT 32 30  ALKPHOS 69 65  BILITOT 0.8 0.7  PROT 6.6 6.5  ALBUMIN 3.3* 3.1*   No results for input(s): "LIPASE", "AMYLASE" in the last 168 hours. No results for input(s): "AMMONIA" in the last 168 hours. Coagulation Profile: No results for input(s): "INR", "PROTIME" in the last 168 hours. CBC: Recent Labs  Lab 09/13/22 2220 09/14/22 0520 09/16/22 0532  WBC 14.3* 13.2* 11.3*  NEUTROABS 11.6* 10.2*  --   HGB 17.0 16.1 14.8  HCT 52.6* 49.6 47.1  MCV 93.9 93.2 95.5  PLT 189 166 152   Cardiac Enzymes: Recent Labs  Lab 09/13/22 2220  CKTOTAL 31*   BNP: Invalid input(s): "POCBNP" CBG: No results for input(s): "GLUCAP" in the last 168 hours. HbA1C: No results for input(s): "HGBA1C" in the last 72 hours. Urine analysis:    Component Value Date/Time   COLORURINE YELLOW 09/14/2022 0045   APPEARANCEUR CLEAR 09/14/2022 0045   LABSPEC 1.017 09/14/2022 0045   PHURINE 5.0 09/14/2022 0045   GLUCOSEU >=500 (A) 09/14/2022 0045   HGBUR MODERATE (A) 09/14/2022 0045   BILIRUBINUR NEGATIVE 09/14/2022 0045   KETONESUR NEGATIVE 09/14/2022 0045   PROTEINUR NEGATIVE 09/14/2022 0045   NITRITE NEGATIVE 09/14/2022 0045   LEUKOCYTESUR NEGATIVE 09/14/2022 0045   Sepsis Labs: @LABRCNTIP (procalcitonin:4,lacticidven:4) ) Recent Results (from the past 240 hour(s))  Urine Culture     Status: Abnormal   Collection Time: 09/14/22 12:45 AM   Specimen: Urine, Clean Catch  Result Value Ref Range Status   Specimen Description   Final    URINE, CLEAN CATCH Performed at [facility], [address], [place], [place] [address]    Special Requests   Final    NONE Performed at [facility], [address] [place], [place] [address]    Culture 20,000 COLONIES/mL ENTEROCOCCUS FAECALIS (A)  Final   Report Status 09/16/2022 FINAL  Final   Organism ID, Bacteria ENTEROCOCCUS FAECALIS (A)  Final      Susceptibility   Enterococcus faecalis - MIC*    AMPICILLIN <=2 SENSITIVE  Sensitive     NITROFURANTOIN <=16 SENSITIVE Sensitive     VANCOMYCIN 1 SENSITIVE Sensitive     * 20,000 COLONIES/mL ENTEROCOCCUS FAECALIS  Resp panel by RT-PCR (RSV, Flu A&B, Covid) Anterior Nasal Swab     Status: None   Collection Time: 09/15/22  2:05 PM   Specimen: Anterior Nasal Swab  Result Value Ref Range Status   SARS Coronavirus 2 by RT PCR NEGATIVE NEGATIVE Final    Comment: (NOTE) SARS-CoV-2 target nucleic acids are NOT DETECTED.  The SARS-CoV-2 RNA is generally detectable in upper respiratory specimens during the acute phase of infection. The lowest concentration of SARS-CoV-2 viral copies this assay can detect is 138 copies/mL. A negative result does not preclude SARS-Cov-2 infection and should not be used as the sole basis for treatment or other patient management decisions. A negative result may occur with  improper specimen collection/handling, submission of specimen other than nasopharyngeal swab, presence of viral mutation(s) within the areas targeted by this assay, and inadequate number of viral  copies(<138 copies/mL). A negative result must be combined with clinical observations, patient history, and epidemiological information. The expected result is Negative.  Fact Sheet for Patients:  BloggerCourse.com  Fact Sheet for Healthcare Providers:  SeriousBroker.it  This test is no t yet approved or cleared by the [place] FDA and  has been authorized for detection and/or diagnosis of SARS-CoV-2 by FDA under an Emergency Use Authorization (EUA). This EUA will remain  in effect (meaning this test can be used) for the duration of the COVID-19 declaration under Section 564(b)(1) of the Act, 21 U.S.C.section 360bbb-3(b)(1), unless the authorization is terminated  or revoked sooner.       Influenza A by PCR NEGATIVE NEGATIVE Final   Influenza B by PCR NEGATIVE NEGATIVE Final    Comment: (NOTE) The Xpert Xpress  SARS-CoV-2/FLU/RSV plus assay is intended as an aid in the diagnosis of influenza from Nasopharyngeal swab specimens and should not be used as a sole basis for treatment. Nasal washings and aspirates are unacceptable for Xpert Xpress SARS-CoV-2/FLU/RSV testing.  Fact Sheet for Patients: BloggerCourse.com  Fact Sheet for Healthcare Providers: SeriousBroker.it  This test is not yet approved or cleared by the [place] FDA and has been authorized for detection and/or diagnosis of SARS-CoV-2 by FDA under an Emergency Use Authorization (EUA). This EUA will remain in effect (meaning this test can be used) for the duration of the COVID-19 declaration under Section 564(b)(1) of the Act, 21 U.S.C. section 360bbb-3(b)(1), unless the authorization is terminated or revoked.     Resp Syncytial Virus by PCR NEGATIVE NEGATIVE Final    Comment: (NOTE) Fact Sheet for Patients: BloggerCourse.com  Fact Sheet for Healthcare Providers: SeriousBroker.it  This test is not yet approved or cleared by the [place] FDA and has been authorized for detection and/or diagnosis of SARS-CoV-2 by FDA under an Emergency Use Authorization (EUA). This EUA will remain in effect (meaning this test can be used) for the duration of the COVID-19 declaration under Section 564(b)(1) of the Act, 21 U.S.C. section 360bbb-3(b)(1), unless the authorization is terminated or revoked.  Performed at [facility], [address], [place], [place] [address]      Scheduled Meds:  citalopram  20 mg Oral Daily   magnesium oxide  400 mg Oral Daily   metoprolol tartrate  100 mg Oral BID   mometasone-formoterol  2 puff Inhalation BID   pantoprazole  40 mg Oral Daily   predniSONE  12.5 mg Oral Q breakfast   rivaroxaban  20 mg Oral Daily   rosuvastatin  10 mg Oral Daily   Continuous Infusions:   ceFAZolin (ANCEF) IV 1 g  (09/16/22 1327)   lactated ringers 50 mL/hr at 09/15/22 1430    Procedures/Studies: US Venous Img Lower Unilateral Right (DVT)  Result Date: 09/14/2022 CLINICAL DATA:  Edema EXAM: RIGHT LOWER EXTREMITY VENOUS DOPPLER ULTRASOUND TECHNIQUE: Gray-scale sonography with compression, as well as color and duplex ultrasound, were performed to evaluate the deep venous system(s) from the level of the common femoral vein through the popliteal and proximal calf veins. COMPARISON:  None Available. FINDINGS: VENOUS Normal compressibility of the common femoral, superficial femoral, and popliteal veins, as well as the visualized calf veins. Visualized portions of profunda femoral vein and great saphenous vein unremarkable. No filling defects to suggest DVT on grayscale or color Doppler imaging. Doppler waveforms show normal direction of venous flow, normal respiratory plasticity and response to augmentation. Limited views of the contralateral common femoral vein are unremarkable. OTHER None. Limitations:  none IMPRESSION: No evidence of DVT in the right lower extremity. Electronically Signed   By: Caprice Renshaw M.D.   On: 09/14/2022 09:07   CT Head Wo Contrast  Result Date: 09/13/2022 CLINICAL DATA:  Neuro deficit, acute, stroke suspected EXAM: CT HEAD WITHOUT CONTRAST TECHNIQUE: Contiguous axial images were obtained from the base of the skull through the vertex without intravenous contrast. RADIATION DOSE REDUCTION: This exam was performed according to the departmental dose-optimization program which includes automated exposure control, adjustment of the mA and/or kV according to patient size and/or use of iterative reconstruction technique. COMPARISON:  None Available. FINDINGS: Brain: Cerebral ventricle sizes are concordant with the degree of cerebral volume loss. No evidence of large-territorial acute infarction. No parenchymal hemorrhage. No mass lesion. No extra-axial collection. No mass effect or midline shift. No  hydrocephalus. Basilar cisterns are patent. Vascular: No hyperdense vessel. Skull: No acute fracture or focal lesion. Sinuses/Orbits: Paranasal sinuses and mastoid air cells are clear. The orbits are unremarkable. Other: None. IMPRESSION: No acute intracranial abnormality. Electronically Signed   By: Tish Frederickson M.D.   On: 09/13/2022 23:42   DG Chest Portable 1 View  Result Date: 09/13/2022 CLINICAL DATA:  Weakness and multiple falls since Thanksgiving EXAM: PORTABLE CHEST 1 VIEW COMPARISON:  07/28/2021 FINDINGS: Cardiomegaly. Aortic atherosclerotic calcification. No focal consolidation, pleural effusion, or pneumothorax. No acute osseous abnormality. IMPRESSION: No active disease.  Cardiomegaly. Electronically Signed   By: Minerva Fester M.D.   On: 09/13/2022 23:37    Catarina Hartshorn, DO  [facility]  If 7PM-7AM, please contact night-coverage [email or web address] Password [facility] 09/16/2022, 4:42 PM   LOS: 1 day

## 2022-09-17 ENCOUNTER — Inpatient Hospital Stay (HOSPITAL_COMMUNITY): Payer: Medicare Other

## 2022-09-17 DIAGNOSIS — N179 Acute kidney failure, unspecified: Secondary | ICD-10-CM | POA: Diagnosis not present

## 2022-09-17 DIAGNOSIS — I4891 Unspecified atrial fibrillation: Secondary | ICD-10-CM | POA: Diagnosis not present

## 2022-09-17 DIAGNOSIS — R531 Weakness: Secondary | ICD-10-CM | POA: Diagnosis not present

## 2022-09-17 DIAGNOSIS — R627 Adult failure to thrive: Secondary | ICD-10-CM | POA: Diagnosis not present

## 2022-09-17 LAB — URINALYSIS, COMPLETE (UACMP) WITH MICROSCOPIC
Bacteria, UA: NONE SEEN
Bilirubin Urine: NEGATIVE
Glucose, UA: >=500 mg/dL — AB
Hgb urine dipstick: NEGATIVE
Ketones, ur: NEGATIVE mg/dL
Nitrite: NEGATIVE
Protein, ur: NEGATIVE mg/dL
Specific Gravity, Urine: 1.027 (ref 1.005–1.030)
pH: 6 (ref 5.0–8.0)

## 2022-09-17 LAB — BASIC METABOLIC PANEL
Anion gap: 8 (ref 5–15)
BUN: 12 mg/dL (ref 8–23)
CO2: 24 mmol/L (ref 22–32)
Calcium: 9 mg/dL (ref 8.9–10.3)
Chloride: 100 mmol/L (ref 98–111)
Creatinine, Ser: 0.7 mg/dL (ref 0.61–1.24)
GFR, Estimated: >60 mL/min (ref >60)
Glucose, Bld: 130 mg/dL — ABNORMAL HIGH (ref 70–99)
Potassium: 3.7 mmol/L (ref 3.5–5.1)
Sodium: 132 mmol/L — ABNORMAL LOW (ref 135–145)

## 2022-09-17 LAB — HEPATIC FUNCTION PANEL
ALT: 17 U/L (ref 0–44)
AST: 26 U/L (ref 15–41)
Albumin: 2.9 g/dL — ABNORMAL LOW (ref 3.5–5.0)
Alkaline Phosphatase: 67 U/L (ref 38–126)
Bilirubin, Direct: 0.2 mg/dL (ref 0.0–0.2)
Indirect Bilirubin: 0.6 mg/dL (ref 0.3–0.9)
Total Bilirubin: 0.8 mg/dL (ref 0.3–1.2)
Total Protein: 6.3 g/dL — ABNORMAL LOW (ref 6.5–8.1)

## 2022-09-17 LAB — CBC
HCT: 44.9 % (ref 39.0–52.0)
Hemoglobin: 14.4 g/dL (ref 13.0–17.0)
MCH: 30.1 pg (ref 26.0–34.0)
MCHC: 32.1 g/dL (ref 30.0–36.0)
MCV: 93.7 fL (ref 80.0–100.0)
Platelets: 150 10*3/uL (ref 150–400)
RBC: 4.79 MIL/uL (ref 4.22–5.81)
RDW: 16.2 % — ABNORMAL HIGH (ref 11.5–15.5)
WBC: 10.9 10*3/uL — ABNORMAL HIGH (ref 4.0–10.5)
nRBC: 0 % (ref 0.0–0.2)

## 2022-09-17 LAB — AMMONIA: Ammonia: <10 umol/L (ref 9–35)

## 2022-09-17 LAB — BLOOD GAS, VENOUS
Acid-Base Excess: 2.7 mmol/L — ABNORMAL HIGH (ref 0.0–2.0)
Bicarbonate: 28.5 mmol/L — ABNORMAL HIGH (ref 20.0–28.0)
Drawn by: 6509
O2 Saturation: 59 %
Patient temperature: 36.5
pCO2, Ven: 46 mmHg (ref 44–60)
pH, Ven: 7.4 (ref 7.25–7.43)
pO2, Ven: 32 mmHg (ref 32–45)

## 2022-09-17 LAB — MAGNESIUM: Magnesium: 1.8 mg/dL (ref 1.7–2.4)

## 2022-09-17 LAB — VITAMIN B1: Vitamin B1 (Thiamine): 133.2 nmol/L (ref 66.5–200.0)

## 2022-09-17 MED ORDER — PREDNISONE 20 MG PO TABS
60.0000 mg | ORAL_TABLET | Freq: Every day | ORAL | Status: DC
  Administered 2022-09-18: 60 mg via ORAL
  Filled 2022-09-17: qty 3

## 2022-09-17 MED ORDER — PREDNISONE 20 MG PO TABS
50.0000 mg | ORAL_TABLET | Freq: Once | ORAL | Status: AC
  Administered 2022-09-17: 50 mg via ORAL
  Filled 2022-09-17: qty 1

## 2022-09-17 MED ORDER — POLYVINYL ALCOHOL 1.4 % OP SOLN
2.0000 [drp] | Freq: Two times a day (BID) | OPHTHALMIC | Status: DC
  Administered 2022-09-17 – 2022-09-20 (×6): 2 [drp] via OPHTHALMIC
  Filled 2022-09-17: qty 15

## 2022-09-17 MED ORDER — SODIUM CHLORIDE 0.9 % IV SOLN
3.0000 g | Freq: Three times a day (TID) | INTRAVENOUS | Status: AC
  Administered 2022-09-17 – 2022-09-19 (×5): 3 g via INTRAVENOUS
  Filled 2022-09-17 (×5): qty 8

## 2022-09-17 NOTE — Care Management Important Message (Signed)
Important Message  Patient Details  Name: Alexander Johnson MRN: [id number] Date of Birth: 1946-05-03   Medicare Important Message Given:  Yes (spoke with daughter Iver Nestle at [phone] to explain letter, no additional copy needed per daughter, Deatra Canter number provided)     Tommy Medal 09/17/2022, 11:52 AM

## 2022-09-17 NOTE — Progress Notes (Signed)
Physical Therapy Treatment Patient Details Name: Alexander Johnson MRN: [id number] DOB: 01/05/1946 Today's Date: 09/17/2022   History of Present Illness Alexander Johnson is a 77 y.o. 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  Patient himself is a poor historian, daughter helps a lot.  They report that he has been weak from head to toe.  He has not had more weakness on one side versus than the other.  They report the onset was around Thanksgiving is been progressively worse since then.  He also had a flare of his rheumatoid arthritis 2 weeks ago.  He has been taking Plaquenil and prednisone taper for that.  He had a decreased appetite since the week before Christmas.  He had multiple falls.  They came in today because he had another fall.  He was walking when his legs just gave out.  He denies hitting his head, losing consciousness.  He denies any chest pain, palpitations beforehand.  He is on Xarelto and his last dose was in the morning.  No new symptoms.  His right leg is also weak which was not the case previously.  And may require at least short-term placement.    PT Comments    Patient demonstrates slow labored movement for sitting up at bedside with difficulty moving legs due to weakness, fair/good return for completing BLE exercises while seated at bedside without loss of sitting balance, very unsteady on feet requiring multiple attempts before able to stand using RW and limited to a few slow labored unsteady side steps with buckling of knees before having to sit due to fall risk.  Patient tolerated sitting up in chair after therapy - nurse aware.  Patient will benefit from continued skilled physical therapy in hospital and recommended venue below to increase strength, balance, endurance for safe ADLs and gait.       Recommendations for follow up therapy are one component of a multi-disciplinary discharge planning process, led by the attending physician.  Recommendations may be updated based on patient status, additional functional criteria and insurance authorization.  Follow Up Recommendations  Skilled nursing-short term rehab (<3 hours/day) Can patient physically be transported by private vehicle: No   Assistance Recommended at Discharge Set up Supervision/Assistance  Patient can return home with the following A lot of help with walking and/or transfers;A lot of help with bathing/dressing/bathroom;Assistance with cooking/housework;Help with stairs or ramp for entrance   Equipment Recommendations  None recommended by PT    Recommendations for Other Services       Precautions / Restrictions Precautions Precautions: Fall Restrictions Weight Bearing Restrictions: No     Mobility  Bed Mobility Overal bed mobility: Needs Assistance Bed Mobility: Supine to Sit     Supine to sit: Mod assist     General bed mobility comments: slow labored movement with diffiuclty moving legs due to BLE weakness    Transfers Overall transfer level: Needs assistance Equipment used: Rolling walker (2 wheels) Transfers: Sit to/from Stand, Bed to chair/wheelchair/BSC Sit to Stand: Mod assist   Step pivot transfers: Mod assist, Max assist       General transfer comment: required repeated attempts before able to complete sit to stands, very unsteady on feet with buckling of knees    Ambulation/Gait Ambulation/Gait assistance: Mod assist, Max assist Gait Distance (Feet): 3 Feet Assistive device: Rolling walker (2 wheels) Gait Pattern/deviations: Decreased step length - right,  Decreased step length - left, Decreased stride length, Knees buckling Gait velocity: slow     General Gait Details: limited to few slow labored short side steps with buckling of knees due to weakness   Stairs              Wheelchair Mobility    Modified Rankin (Stroke Patients Only)       Balance Overall balance assessment: Needs assistance Sitting-balance support: Feet supported, No upper extremity supported Sitting balance-Leahy Scale: Fair Sitting balance - Comments: fair/good seated at EOB   Standing balance support: Reliant on assistive device for balance, During functional activity, Bilateral upper extremity supported Standing balance-Leahy Scale: Poor Standing balance comment: fair/poor using RW                            Cognition Arousal/Alertness: Awake/alert Behavior During Therapy: WFL for tasks assessed/performed Overall Cognitive Status: Within Functional Limits for tasks assessed                                          Exercises General Exercises - Lower Extremity Long Arc Quad: Seated, AROM, Strengthening, Both, 10 reps Hip Flexion/Marching: Seated, AROM, Strengthening, Both, 10 reps Toe Raises: Seated, AROM, Strengthening, 10 reps, Right Heel Raises: Seated, AROM, Strengthening, Both, 10 reps    General Comments        Pertinent Vitals/Pain Pain Assessment Pain Assessment: No/denies pain    Home Living                          Prior Function            PT Goals (current goals can now be found in the care plan section) Acute Rehab PT Goals Patient Stated Goal: return home after rehab PT Goal Formulation: With patient Time For Goal Achievement: 09/28/22 Potential to Achieve Goals: Good Progress towards PT goals: Progressing toward goals    Frequency    Min 3X/week      PT Plan Current plan remains appropriate    Co-evaluation              AM-PAC PT "6 Clicks" Mobility   Outcome Measure  Help needed turning from your back to your side while in a flat bed without using bedrails?: A Lot Help needed moving from lying on your back to sitting on the side of a flat bed without using bedrails?: A Lot Help  needed moving to and from a bed to a chair (including a wheelchair)?: A Lot Help needed standing up from a chair using your arms (e.g., wheelchair or bedside chair)?: A Lot Help needed to walk in hospital room?: A Lot Help needed climbing 3-5 steps with a railing? : Total 6 Click Score: 11    End of Session   Activity Tolerance: Patient tolerated treatment well;Patient limited by fatigue Patient left: in chair;with call bell/phone within reach Nurse Communication: Mobility status PT Visit Diagnosis: Unsteadiness on feet (R26.81);Other abnormalities of gait and mobility (R26.89);Muscle weakness (generalized) (M62.81)     Time: 3220-2542 PT Time Calculation (min) (ACUTE ONLY): 24 min  Charges:  $Therapeutic Exercise: 8-22 mins $Therapeutic Activity: 8-22 mins                     2:05 PM, 09/17/22 Lonell Grandchild, MPT Physical Therapist with  [facility] 336 [phone] office [phone] mobile phone

## 2022-09-17 NOTE — Progress Notes (Signed)
PROGRESS NOTE  Alexander Johnson ZOX:[id number] DOB: 04-May-1946 DOA: 09/13/2022 PCP: Eldridge Abrahams, MD  Brief History:  77 year old male with a history of atrial fibrillation, COPD, hypertension, hyperlipidemia, polio with left lower extremity weakness presenting with generalized weakness.  The patient is a poor historian, but states that he is weaker than usual.  Daughter assists with the history.  She states that the patient has been progressively weaker since Thanksgiving.  He has had decreased oral intake since Christmas.  Because of the weakness, the patient has had multiple falls.  At baseline, the patient was able to ambulate with a walker.  However, his weakness has progressed to the point where he needed assistance of 3 people to get out of bed.  The patient has been falling but denies hitting his head or losing consciousness.  He denies any fevers, chills, chest pain, shortness breath, nausea, vomiting, diarrhea.  In the ED, the patient had WBC 14.3, hemoglobin 17.0, platelets 189,000.  Serum creatinine was 1.10.  The patient was started on IV fluids.  He was admitted for further evaluation and treatment of his generalized weakness.   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   AKI -Secondary to volume depletion -Baseline creatinine 0.7-0.8 -Presented with serum creatinine 1.10 -Improved with IV fluid--continue  Scleritis -increase prednisone dose to 60 mg daily and wean -trying to avoid NSAIDS with DOAC  Delirium -due to hospital delirium with  sundowning -work up as above -check VBG -ammonia -change to unasyn to cover E faecalis   Mixed hyperlipidemia -Continue statin   Rheumatoid arthritis--seronegative -Continue Plaquenil   Essential hypertension -Continue metoprolol tartrate   Atrial fibrillation, type unspecified -Rate controlled -Continue metoprolol tartrate -Continue rivaroxaban   Essential HTN -continue metoprolol tartrate   Family Communication:   daughter updated 09/17/22   Consultants:  none   Code Status:  FULL   DVT Prophylaxis: Xarelto     Procedures: As Listed in Progress Note Above   Antibiotics: Cefazolin 1/10>>1/12 Unasyn 1/12       Subjective: Patient denies fevers, chills, headache, chest pain, dyspnea, nausea, vomiting, diarrhea, abdominal pain, dysuria, hematuria, hematochezia, and melena.   Objective: Vitals:   09/17/22 0318 09/17/22 0742 09/17/22 1219 09/17/22 1500  BP: 102/71  112/70 109/78  Pulse: 80 63 (!) 110 68  Resp: 20 16 20 17   Temp: 97.8 F (36.6 C)  97.6 F (36.4 C) 97.8 F (36.6 C)  TempSrc: Oral  Oral   SpO2: 94% 95% 98% 93%  Weight:      Height:        Intake/Output Summary (Last 24 hours) at 09/17/2022 1751 Last data filed at 09/17/2022 1002 Gross per 24 hour  Intake 240 ml  Output 1450 ml  Net -1210 ml   Weight change:  Exam:  General:  Pt is alert, follows commands appropriately, not in acute distress HEENT: No icterus, No thrush, No neck mass, [place]/AT Cardiovascular: RRR, S1/S2, no rubs, no gallops Respiratory: fine bibasilar crackles. No wheeze Abdomen: Soft/+BS, non tender, non distended, no guarding Extremities: No edema, mild erythema right leg up to upper tibia without lymphangitis or pus  Data Reviewed: I have personally reviewed following labs and imaging studies Basic Metabolic Panel: Recent Labs  Lab 09/13/22 2220 09/14/22 0520 09/15/22 0338 09/16/22 0710 09/17/22 0341  NA 135 136 137 135 132*  K 3.6 3.3* 3.9 3.8 3.7   CL 96* 98 98 99 100  CO2 24 28 30 26 24   GLUCOSE 197* 136* 134* 112* 130*  BUN 22 19 17 14 12   CREATININE 1.10 0.86 0.82 0.76 0.70  CALCIUM 9.7 9.6 9.7 8.9 9.0  MG  --  1.5* 2.1 1.7 1.8   Liver Function Tests: Recent Labs  Lab 09/13/22 2220 09/14/22 0520  AST 33 23  ALT 32 30  ALKPHOS 69 65  BILITOT 0.8 0.7  PROT 6.6 6.5  ALBUMIN 3.3* 3.1*   No results for input(s): "LIPASE", "AMYLASE" in the last 168 hours. No results for input(s): "AMMONIA" in the last 168 hours. Coagulation Profile: No results for input(s): "INR", "PROTIME" in the last 168 hours. CBC: Recent Labs  Lab 09/13/22 2220 09/14/22 0520 09/16/22 0532 09/17/22 0341  WBC 14.3* 13.2* 11.3* 10.9*  NEUTROABS 11.6* 10.2*  --   --   HGB 17.0 16.1 14.8 14.4  HCT 52.6* 49.6 47.1 44.9  MCV 93.9 93.2 95.5 93.7  PLT 189 166 152 150   Cardiac Enzymes: Recent Labs  Lab 09/13/22 2220  CKTOTAL 31*   BNP: Invalid input(s): "POCBNP" CBG: No results for input(s): "GLUCAP" in the last 168 hours. HbA1C: No results for input(s): "HGBA1C" in the last 72 hours. Urine analysis:    Component Value Date/Time   COLORURINE YELLOW 09/14/2022 0045   APPEARANCEUR CLEAR 09/14/2022 0045   LABSPEC 1.017 09/14/2022 0045   PHURINE 5.0 09/14/2022 0045   GLUCOSEU >=500 (A) 09/14/2022 0045   HGBUR MODERATE (A) 09/14/2022 0045   BILIRUBINUR NEGATIVE 09/14/2022 0045   KETONESUR NEGATIVE 09/14/2022 0045   PROTEINUR NEGATIVE 09/14/2022 0045   NITRITE NEGATIVE 09/14/2022 0045   LEUKOCYTESUR NEGATIVE 09/14/2022 0045   Sepsis Labs: @LABRCNTIP (procalcitonin:4,lacticidven:4) ) Recent Results (from the past 240 hour(s))  Urine Culture     Status: Abnormal   Collection Time: 09/14/22 12:45 AM   Specimen: Urine, Clean Catch  Result Value Ref Range Status   Specimen Description   Final    URINE, CLEAN CATCH Performed at [facility], [address], [place], [place] [address]    Special Requests   Final    NONE Performed at  [facility], [address] [place], [place] [address]    Culture 20,000 COLONIES/mL ENTEROCOCCUS FAECALIS (A)  Final   Report Status 09/16/2022 FINAL  Final   Organism ID, Bacteria ENTEROCOCCUS FAECALIS (A)  Final      Susceptibility   Enterococcus faecalis - MIC*    AMPICILLIN <=2 SENSITIVE Sensitive     NITROFURANTOIN <=16 SENSITIVE Sensitive     VANCOMYCIN 1 SENSITIVE Sensitive     * 20,000 COLONIES/mL ENTEROCOCCUS FAECALIS  Resp panel by RT-PCR (RSV, Flu A&B, Covid) Anterior Nasal Swab     Status: None   Collection Time: 09/15/22  2:05 PM   Specimen: Anterior Nasal Swab  Result Value Ref Range Status   SARS Coronavirus 2 by RT PCR NEGATIVE NEGATIVE Final    Comment: (NOTE) SARS-CoV-2 target nucleic acids are NOT DETECTED.  The SARS-CoV-2 RNA is generally detectable in upper respiratory specimens during the acute phase of infection. The lowest concentration of SARS-CoV-2 viral copies this assay can detect is 138 copies/mL. A negative result does not preclude SARS-Cov-2 infection and  should not be used as the sole basis for treatment or other patient management decisions. A negative result may occur with  improper specimen collection/handling, submission of specimen other than nasopharyngeal swab, presence of viral mutation(s) within the areas targeted by this assay, and inadequate number of viral copies(<138 copies/mL). A negative result must be combined with clinical observations, patient history, and epidemiological information. The expected result is Negative.  Fact Sheet for Patients:  BloggerCourse.com  Fact Sheet for Healthcare Providers:  SeriousBroker.it  This test is no t yet approved or cleared by the [place] FDA and  has been authorized for detection and/or diagnosis of SARS-CoV-2 by FDA under an Emergency Use Authorization (EUA). This EUA will remain  in effect (meaning this test can be used) for the  duration of the COVID-19 declaration under Section 564(b)(1) of the Act, 21 U.S.C.section 360bbb-3(b)(1), unless the authorization is terminated  or revoked sooner.       Influenza A by PCR NEGATIVE NEGATIVE Final   Influenza B by PCR NEGATIVE NEGATIVE Final    Comment: (NOTE) The Xpert Xpress SARS-CoV-2/FLU/RSV plus assay is intended as an aid in the diagnosis of influenza from Nasopharyngeal swab specimens and should not be used as a sole basis for treatment. Nasal washings and aspirates are unacceptable for Xpert Xpress SARS-CoV-2/FLU/RSV testing.  Fact Sheet for Patients: BloggerCourse.com  Fact Sheet for Healthcare Providers: SeriousBroker.it  This test is not yet approved or cleared by the [place] FDA and has been authorized for detection and/or diagnosis of SARS-CoV-2 by FDA under an Emergency Use Authorization (EUA). This EUA will remain in effect (meaning this test can be used) for the duration of the COVID-19 declaration under Section 564(b)(1) of the Act, 21 U.S.C. section 360bbb-3(b)(1), unless the authorization is terminated or revoked.     Resp Syncytial Virus by PCR NEGATIVE NEGATIVE Final    Comment: (NOTE) Fact Sheet for Patients: BloggerCourse.com  Fact Sheet for Healthcare Providers: SeriousBroker.it  This test is not yet approved or cleared by the [place] FDA and has been authorized for detection and/or diagnosis of SARS-CoV-2 by FDA under an Emergency Use Authorization (EUA). This EUA will remain in effect (meaning this test can be used) for the duration of the COVID-19 declaration under Section 564(b)(1) of the Act, 21 U.S.C. section 360bbb-3(b)(1), unless the authorization is terminated or revoked.  Performed at [facility], [address], [place], [place] [address]      Scheduled Meds:  magnesium oxide  400 mg Oral Daily    metoprolol tartrate  100 mg Oral BID   mometasone-formoterol  2 puff Inhalation BID   pantoprazole  40 mg Oral Daily   predniSONE  12.5 mg Oral Q breakfast   rivaroxaban  20 mg Oral Daily   rosuvastatin  10 mg Oral Daily   Continuous Infusions:  ampicillin-sulbactam (UNASYN) IV     lactated ringers 50 mL/hr at 09/15/22 1430    Procedures/Studies: DG CHEST PORT 1 VIEW  Result Date: 09/17/2022 CLINICAL DATA:  Hypoxia.  Confusion. EXAM: PORTABLE CHEST 1 VIEW COMPARISON:  09/13/2022 FINDINGS: Mild enlargement of the cardiopericardial silhouette. No edema. Apical lordotic projection. The lungs appear clear. No blunting of the costophrenic angles. Degenerative glenohumeral arthropathy bilaterally. IMPRESSION: 1. Mild enlargement of the cardiopericardial silhouette, without edema. 2. Degenerative glenohumeral arthropathy bilaterally. Electronically Signed   By: Gaylyn Rong M.D.   On: 09/17/2022 16:31   MR CERVICAL SPINE WO CONTRAST  Result Date: 09/17/2022 CLINICAL DATA:  History of polio.  Now with generalized weakness. EXAM: MRI CERVICAL SPINE WITHOUT CONTRAST TECHNIQUE: Multiplanar, multisequence MR imaging of the cervical spine was performed. No intravenous contrast was administered. COMPARISON:  None Available. FINDINGS: Alignment: Reversal of the cervical curvature. Small anterolisthesis of C2 over C3. Vertebrae: No fracture, evidence of discitis, or bone lesion. Partial fusion of the C3-4 endplates and left facet joint. Cord: Mild mass effect on the cord at C4-5, C5-6 and C6-7. The cord appear flattened and with decreased volume along at these levels increased T2 signal centrally in the cord at the C3-4 and C4-5 levels on axial T2 images. Posterior Fossa, vertebral arteries, paraspinal tissues: Negative. Disc levels: C2-3: Minimal disc bulge without significant spinal canal stenosis. Uncovertebral and facet degenerative changes resulting in mild right and severe left neural foraminal  narrowing. C3-4: Posterior disc osteophyte complex resulting in mild spinal canal stenosis. Uncovertebral and facet degenerative change resulting in severe bilateral neural foraminal narrowing. C4-5: Posterior disc osteophyte complex asymmetric to the right causing mild mass effect on the cord and resulting in moderate spinal canal stenosis. Uncovertebral and facet degenerative changes resulting in severe right and moderate left neural foraminal narrowing. C5-6: Posterior disc osteophyte complex resulting in mild spinal canal stenosis mild mass effect on cord. Uncovertebral and facet degenerative changes resulting in severe bilateral neural foraminal narrowing. C6-7: Posterior disc osteophyte complex with mild mass effect on the cord, without significant spinal canal stenosis. Uncovertebral and facet degenerative changes resulting in moderate right and severe left neural. C7-T1: Posterior disc osteophyte without significant spinal canal stenosis. Uncovertebral and facet degenerative changes resulting in severe bilateral neural. IMPRESSION: 1. 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 2. High-grade bilateral neural foraminal narrowing from C3-4 through C7-T1. Electronically Signed   By: Baldemar Lenis M.D.   On: 09/17/2022 09:00   MR BRAIN WO CONTRAST  Result Date: 09/17/2022 CLINICAL DATA:  Neuro deficit, acute, stroke suspected. Failure to thrive. Generalized weakness. EXAM: MRI HEAD WITHOUT CONTRAST TECHNIQUE: Multiplanar, multiecho pulse sequences of the brain and surrounding structures were obtained without intravenous contrast. COMPARISON:  Head CT September 13, 2022. FINDINGS: Brain: No acute infarction, hemorrhage, hydrocephalus, extra-axial collection or mass lesion. 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 Mild patchy T2 hyperintensity within the pons, nonspecific, most  likely related to chronic small vessel ischemia. Mild parenchymal volume loss. Vascular: Normal flow voids. Skull and upper cervical spine: Normal marrow signal. Sinuses/Orbits: Negative. Other: None. IMPRESSION: 1. No acute intracranial abnormality. 2. 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 3. Mild chronic microvascular ischemic changes of the white matter and parenchymal volume loss. Electronically Signed   By: Baldemar Lenis M.D.   On: 09/17/2022 08:46   US Venous Img Lower Unilateral Right (DVT)  Result Date: 09/14/2022 CLINICAL DATA:  Edema EXAM: RIGHT LOWER EXTREMITY VENOUS DOPPLER ULTRASOUND TECHNIQUE: Gray-scale sonography with compression, as well as color and duplex ultrasound, were performed to evaluate the deep venous system(s) from the level of the common femoral vein through the popliteal and proximal calf veins. COMPARISON:  None Available. FINDINGS: VENOUS Normal compressibility of the common femoral, superficial femoral, and popliteal veins, as well as the visualized calf veins. Visualized portions of profunda femoral vein and great saphenous vein unremarkable. No filling defects to suggest DVT on grayscale or color Doppler imaging. Doppler waveforms show normal direction of venous flow, normal respiratory  plasticity and response to augmentation. Limited views of the contralateral common femoral vein are unremarkable. OTHER None. Limitations: none IMPRESSION: No evidence of DVT in the right lower extremity. Electronically Signed   By: Maurine Simmering M.D.   On: 09/14/2022 09:07   CT Head Wo Contrast  Result Date: 09/13/2022 CLINICAL DATA:  Neuro deficit, acute, stroke suspected EXAM: CT HEAD WITHOUT CONTRAST TECHNIQUE: Contiguous axial images were obtained from the base of the skull through the vertex without intravenous contrast. RADIATION DOSE REDUCTION: This exam was performed according to the departmental dose-optimization program which  includes automated exposure control, adjustment of the mA and/or kV according to patient size and/or use of iterative reconstruction technique. COMPARISON:  None Available. FINDINGS: Brain: Cerebral ventricle sizes are concordant with the degree of cerebral volume loss. No evidence of large-territorial acute infarction. No parenchymal hemorrhage. No mass lesion. No extra-axial collection. No mass effect or midline shift. No hydrocephalus. Basilar cisterns are patent. Vascular: No hyperdense vessel. Skull: No acute fracture or focal lesion. Sinuses/Orbits: Paranasal sinuses and mastoid air cells are clear. The orbits are unremarkable. Other: None. IMPRESSION: No acute intracranial abnormality. Electronically Signed   By: Iven Finn M.D.   On: 09/13/2022 23:42   DG Chest Portable 1 View  Result Date: 09/13/2022 CLINICAL DATA:  Weakness and multiple falls since Thanksgiving EXAM: PORTABLE CHEST 1 VIEW COMPARISON:  07/28/2021 FINDINGS: Cardiomegaly. Aortic atherosclerotic calcification. No focal consolidation, pleural effusion, or pneumothorax. No acute osseous abnormality. IMPRESSION: No active disease.  Cardiomegaly. Electronically Signed   By: Placido Sou M.D.   On: 09/13/2022 23:37    Orson Eva, DO  [facility]  If 7PM-7AM, please contact night-coverage [email or web address] Password [facility] 09/17/2022, 5:51 PM   LOS: 2 days

## 2022-09-18 DIAGNOSIS — N179 Acute kidney failure, unspecified: Secondary | ICD-10-CM | POA: Diagnosis not present

## 2022-09-18 DIAGNOSIS — I4891 Unspecified atrial fibrillation: Secondary | ICD-10-CM | POA: Diagnosis not present

## 2022-09-18 DIAGNOSIS — L03115 Cellulitis of right lower limb: Secondary | ICD-10-CM | POA: Diagnosis not present

## 2022-09-18 DIAGNOSIS — R531 Weakness: Secondary | ICD-10-CM | POA: Diagnosis not present

## 2022-09-18 MED ORDER — PREDNISONE 20 MG PO TABS
40.0000 mg | ORAL_TABLET | Freq: Every day | ORAL | Status: DC
  Administered 2022-09-19 – 2022-09-20 (×2): 40 mg via ORAL
  Filled 2022-09-18 (×2): qty 2

## 2022-09-18 MED ORDER — VENLAFAXINE HCL ER 75 MG PO CP24
150.0000 mg | ORAL_CAPSULE | Freq: Every day | ORAL | Status: DC
  Administered 2022-09-18 – 2022-09-20 (×3): 150 mg via ORAL
  Filled 2022-09-18 (×3): qty 2

## 2022-09-18 MED ORDER — PREDNISONE 20 MG PO TABS: 40.0000 mg | ORAL_TABLET | Freq: Every day | ORAL | Status: AC

## 2022-09-18 MED ORDER — AMOXICILLIN-POT CLAVULANATE 875-125 MG PO TABS
1.0000 | ORAL_TABLET | Freq: Two times a day (BID) | ORAL | Status: DC
  Administered 2022-09-19 – 2022-09-20 (×3): 1 via ORAL
  Filled 2022-09-18 (×3): qty 1

## 2022-09-18 MED ORDER — HYDROXYCHLOROQUINE SULFATE 200 MG PO TABS
400.0000 mg | ORAL_TABLET | Freq: Every day | ORAL | Status: DC
  Administered 2022-09-18 – 2022-09-20 (×3): 400 mg via ORAL
  Filled 2022-09-18 (×3): qty 2

## 2022-09-18 MED ORDER — AMOXICILLIN-POT CLAVULANATE 875-125 MG PO TABS: 1.0000 | ORAL_TABLET | Freq: Two times a day (BID) | ORAL | Status: DC

## 2022-09-18 NOTE — Progress Notes (Signed)
Patient placed on CPAP 7 around 3 am.

## 2022-09-18 NOTE — Plan of Care (Signed)

## 2022-09-18 NOTE — Discharge Summary (Signed)
Physician Discharge Summary   Patient: Alexander Johnson MRN: [id number] DOB: Dec 18, 1945  Admit date:     09/13/2022  Discharge date: 09/18/22  Discharge Physician: Onalee Hua Detravion Tester   PCP: Eldridge Abrahams, MD   Recommendations at discharge:   Please follow up with primary care provider within 1-2 weeks  Please repeat BMP and CBC in one week     Hospital Course: 77 year old male with a history of atrial fibrillation, COPD, hypertension, hyperlipidemia, polio with left lower extremity weakness presenting with generalized weakness.  The patient is a poor historian, but states that he is weaker than usual.  Daughter assists with the history.  She states that the patient has been progressively weaker since Thanksgiving.  He has had decreased oral intake since Christmas.  Because of the weakness, the patient has had multiple falls.  At baseline, the patient was able to ambulate with a walker.  However, his weakness has progressed to the point where he needed assistance of 3 people to get out of bed.  The patient has been falling but denies hitting his head or losing consciousness.  He denies any fevers, chills, chest pain, shortness breath, nausea, vomiting, diarrhea.  In the ED, the patient had WBC 14.3, hemoglobin 17.0, platelets 189,000.  Serum creatinine was 1.10.  The patient was started on IV fluids.  He was admitted for further evaluation and treatment of his generalized weakness.  Assessment and Plan: Cellulitis right lower extremity -Patient has failed outpatient oral antibiotics -continue IV unasyn>>d/c with amox/clav x 5 more days -This may be partly contributing to his worsening generalized weakness -overall improving   Failure to thrive/generalized weakness -UA negative for pyuria -Folic acid 14.2 -B12---555 -TSH 2.622 -CPK 31 -PT evaluation>>SNF -continued IVF>>saline locked when po intake improved -MR brain--no acute findings -MR C-spine--multilevel advanced degenerative  changes with mod spinal cord stenosis -ambulatory referral to neuromuscular specialist r/o post-polio syndrome vs other neuromuscular disorder (daughter endorses occasional bulbar type speech)   AKI -Secondary to volume depletion -Baseline creatinine 0.7-0.8 -Presented with serum creatinine 1.10 -Improved with IV fluid--continue -serum creatinine 0.70 at the time of d/c   Scleritis -started on prednisone 60 mg daily inpatient>>improving -wean back to baseline prednisone 12.5 mg daily over the next week -trying to avoid NSAIDS with DOAC   Delirium -due to hospital delirium with sundowning -personally reviewed EKG--afib, nonspecific STT change -personally reviewed CXR--no edema, no infiltrates -work up as above -check VBG 7.4/46/32/28 -ammonia <10 -change to unasyn to cover E faecalis in urine -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 underlying MCI   Mixed hyperlipidemia -Continue statin   Rheumatoid arthritis--seronegative -Continue Plaquenil   Essential hypertension -Continue metoprolol tartrate   Atrial fibrillation, type unspecified -Rate controlled -Continue metoprolol tartrate -Continue rivaroxaban   Essential HTN -continue metoprolol tartrate     Consultants: none Procedures performed: none  Disposition: Skilled nursing facility Diet recommendation:  Cardiac diet DISCHARGE MEDICATION: Allergies as of 09/18/2022       Reactions   Metformin Diarrhea        Medication List     TAKE these medications    albuterol 108 (90 Base) MCG/ACT inhaler Commonly known as: VENTOLIN HFA Inhale into the lungs.   amoxicillin-clavulanate 875-125 MG tablet Commonly known as: AUGMENTIN Take 1 tablet by mouth every 12 (twelve) hours. X 5 days Start taking on: September 19, 2022   Cholecalciferol 50 MCG (2000 UT) Caps Take 1 capsule by mouth daily.   docusate sodium 100 MG capsule Commonly known as: COLACE Take 100 mg  by mouth daily.   fluticasone 50 MCG/ACT nasal spray Commonly  known as: FLONASE Place 2 sprays into both nostrils daily as needed for allergies.   Gemtesa 75 MG Tabs Generic drug: Vibegron Take 1 tablet by mouth daily.   Jardiance 10 MG Tabs tablet Generic drug: empagliflozin Take 10 mg by mouth daily.   metoprolol tartrate 100 MG tablet Commonly known as: LOPRESSOR Take 100 mg by mouth 2 (two) times daily.   pantoprazole 40 MG tablet Commonly known as: PROTONIX Take 40 mg by mouth daily.   Plaquenil 200 MG tablet Generic drug: hydroxychloroquine Take 400 mg by mouth daily.   predniSONE 20 MG tablet Commonly known as: DELTASONE Take 2 tablets (40 mg total) by mouth daily with breakfast. X 2 days, then 30 mg daily x 2 days, then 20 mg daily x 2 days, then 15 mg daily x 2 days, then back to 12.5 mg daily Start taking on: September 19, 2022 What changed:  medication strength how much to take when to take this additional instructions   rivaroxaban 20 MG Tabs tablet Commonly known as: XARELTO Take 20 mg by mouth daily.   rosuvastatin 10 MG tablet Commonly known as: CRESTOR Take 10 mg by mouth daily.   spironolactone 25 MG tablet Commonly known as: ALDACTONE Take 25 mg by mouth daily.   Symbicort 160-4.5 MCG/ACT inhaler Generic drug: budesonide-formoterol Inhale 2 puffs into the lungs 2 (two) times daily.   torsemide 10 MG tablet Commonly known as: DEMADEX Take 10 mg by mouth daily.   venlafaxine XR 75 MG 24 hr capsule Commonly known as: EFFEXOR-XR Take 150 mg by mouth daily.        Discharge Exam: Filed Weights   09/13/22 2159  Weight: 97.5 kg   HEENT:  [place]/AT, No thrush, no icterus CV:  RRR, no rub, no S3, no S4 Lung:  bibasilar crackles. No wheeze Abd:  soft/+BS, NT Ext:  No edema, no lymphangitis, no synovitis, no rash;  mild erythema bilateral lower ext   Condition at discharge: stable  The results of significant diagnostics from this hospitalization (including imaging, microbiology, ancillary and  laboratory) are listed below for reference.   Imaging Studies: DG CHEST PORT 1 VIEW  Result Date: 09/17/2022 CLINICAL DATA:  Hypoxia.  Confusion. EXAM: PORTABLE CHEST 1 VIEW COMPARISON:  09/13/2022 FINDINGS: Mild enlargement of the cardiopericardial silhouette. No edema. Apical lordotic projection. The lungs appear clear. No blunting of the costophrenic angles. Degenerative glenohumeral arthropathy bilaterally. IMPRESSION: 1. Mild enlargement of the cardiopericardial silhouette, without edema. 2. Degenerative glenohumeral arthropathy bilaterally. Electronically Signed   By: Van Clines M.D.   On: 09/17/2022 16:31   MR CERVICAL SPINE WO CONTRAST  Result Date: 09/17/2022 CLINICAL DATA:  History of polio.  Now with generalized weakness. EXAM: MRI CERVICAL SPINE WITHOUT CONTRAST TECHNIQUE: Multiplanar, multisequence MR imaging of the cervical spine was performed. No intravenous contrast was administered. COMPARISON:  None Available. FINDINGS: Alignment: Reversal of the cervical curvature. Small anterolisthesis of C2 over C3. Vertebrae: No fracture, evidence of discitis, or bone lesion. Partial fusion of the C3-4 endplates and left facet joint. Cord: Mild mass effect on the cord at C4-5, C5-6 and C6-7. The cord appear flattened and with decreased volume along at these levels increased T2 signal centrally in the cord at the C3-4 and C4-5 levels on axial T2 images. Posterior Fossa, vertebral arteries, paraspinal tissues: Negative. Disc levels: C2-3: Minimal disc bulge without significant spinal canal stenosis. Uncovertebral and facet degenerative changes resulting  in mild right and severe left neural foraminal narrowing. C3-4: Posterior disc osteophyte complex resulting in mild spinal canal stenosis. Uncovertebral and facet degenerative change resulting in severe bilateral neural foraminal narrowing. C4-5: Posterior disc osteophyte complex asymmetric to the right causing mild mass effect on the cord and  resulting in moderate spinal canal stenosis. Uncovertebral and facet degenerative changes resulting in severe right and moderate left neural foraminal narrowing. C5-6: Posterior disc osteophyte complex resulting in mild spinal canal stenosis mild mass effect on cord. Uncovertebral and facet degenerative changes resulting in severe bilateral neural foraminal narrowing. C6-7: Posterior disc osteophyte complex with mild mass effect on the cord, without significant spinal canal stenosis. Uncovertebral and facet degenerative changes resulting in moderate right and severe left neural. C7-T1: Posterior disc osteophyte without significant spinal canal stenosis. Uncovertebral and facet degenerative changes resulting in severe bilateral neural. IMPRESSION: 1. 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 2. High-grade bilateral neural foraminal narrowing from C3-4 through C7-T1. Electronically Signed   By: Baldemar Lenis M.D.   On: 09/17/2022 09:00   MR BRAIN WO CONTRAST  Result Date: 09/17/2022 CLINICAL DATA:  Neuro deficit, acute, stroke suspected. Failure to thrive. Generalized weakness. EXAM: MRI HEAD WITHOUT CONTRAST TECHNIQUE: Multiplanar, multiecho pulse sequences of the brain and surrounding structures were obtained without intravenous contrast. COMPARISON:  Head CT September 13, 2022. FINDINGS: Brain: No acute infarction, hemorrhage, hydrocephalus, extra-axial collection or mass lesion. 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 Mild patchy T2 hyperintensity within the pons, nonspecific, most likely related to chronic small vessel ischemia. Mild parenchymal volume loss. Vascular: Normal flow voids. Skull and upper cervical spine: Normal marrow signal. Sinuses/Orbits: Negative. Other: None. IMPRESSION: 1. No acute intracranial abnormality. 2. 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 3. Mild chronic microvascular ischemic changes of the white matter and parenchymal volume loss. Electronically Signed   By: Baldemar Lenis M.D.   On: 09/17/2022 08:46   US Venous Img Lower Unilateral Right (DVT)  Result Date: 09/14/2022 CLINICAL DATA:  Edema EXAM: RIGHT LOWER EXTREMITY VENOUS DOPPLER ULTRASOUND TECHNIQUE: Gray-scale sonography with compression, as well as color and duplex ultrasound, were performed to evaluate the deep venous system(s) from the level of the common femoral vein through the popliteal and proximal calf veins. COMPARISON:  None Available. FINDINGS: VENOUS Normal compressibility of the common femoral, superficial femoral, and popliteal veins, as well as the visualized calf veins. Visualized portions of profunda femoral vein and great saphenous vein unremarkable. No filling defects to suggest DVT on grayscale or color Doppler imaging. Doppler waveforms show normal direction of venous flow, normal respiratory plasticity and response to augmentation. Limited views of the contralateral common femoral vein are unremarkable. OTHER None. Limitations: none IMPRESSION: No evidence of DVT in the right lower extremity. Electronically Signed   By: Caprice Renshaw M.D.   On: 09/14/2022 09:07   CT Head Wo Contrast  Result Date: 09/13/2022 CLINICAL DATA:  Neuro deficit, acute, stroke suspected EXAM: CT HEAD WITHOUT CONTRAST TECHNIQUE: Contiguous axial images were obtained from the base of the skull through the vertex without intravenous contrast. RADIATION DOSE REDUCTION: This exam was performed according to the departmental dose-optimization program which includes automated exposure control, adjustment of the mA and/or kV according to patient size and/or use of iterative reconstruction technique. COMPARISON:  None Available. FINDINGS: Brain: Cerebral ventricle sizes  are concordant with the degree of cerebral volume loss. No evidence of large-territorial  acute infarction. No parenchymal hemorrhage. No mass lesion. No extra-axial collection. No mass effect or midline shift. No hydrocephalus. Basilar cisterns are patent. Vascular: No hyperdense vessel. Skull: No acute fracture or focal lesion. Sinuses/Orbits: Paranasal sinuses and mastoid air cells are clear. The orbits are unremarkable. Other: None. IMPRESSION: No acute intracranial abnormality. Electronically Signed   By: Iven Finn M.D.   On: 09/13/2022 23:42   DG Chest Portable 1 View  Result Date: 09/13/2022 CLINICAL DATA:  Weakness and multiple falls since Thanksgiving EXAM: PORTABLE CHEST 1 VIEW COMPARISON:  07/28/2021 FINDINGS: Cardiomegaly. Aortic atherosclerotic calcification. No focal consolidation, pleural effusion, or pneumothorax. No acute osseous abnormality. IMPRESSION: No active disease.  Cardiomegaly. Electronically Signed   By: Placido Sou M.D.   On: 09/13/2022 23:37    Microbiology: Results for orders placed or performed during the hospital encounter of 09/13/22  Urine Culture     Status: Abnormal   Collection Time: 09/14/22 12:45 AM   Specimen: Urine, Clean Catch  Result Value Ref Range Status   Specimen Description   Final    URINE, CLEAN CATCH Performed at [facility], [address] [place], [place] [address]    Special Requests   Final    NONE Performed at [facility], [address], [place], [place] [address]    Culture 20,000 COLONIES/mL ENTEROCOCCUS FAECALIS (A)  Final   Report Status 09/16/2022 FINAL  Final   Organism ID, Bacteria ENTEROCOCCUS FAECALIS (A)  Final      Susceptibility   Enterococcus faecalis - MIC*    AMPICILLIN <=2 SENSITIVE Sensitive     NITROFURANTOIN <=16 SENSITIVE Sensitive     VANCOMYCIN 1 SENSITIVE Sensitive     * 20,000 COLONIES/mL ENTEROCOCCUS FAECALIS  Resp panel by RT-PCR (RSV, Flu A&B, Covid) Anterior Nasal Swab     Status: None   Collection Time: 09/15/22  2:05 PM   Specimen: Anterior Nasal Swab  Result Value Ref Range  Status   SARS Coronavirus 2 by RT PCR NEGATIVE NEGATIVE Final    Comment: (NOTE) SARS-CoV-2 target nucleic acids are NOT DETECTED.  The SARS-CoV-2 RNA is generally detectable in upper respiratory specimens during the acute phase of infection. The lowest concentration of SARS-CoV-2 viral copies this assay can detect is 138 copies/mL. A negative result does not preclude SARS-Cov-2 infection and should not be used as the sole basis for treatment or other patient management decisions. A negative result may occur with  improper specimen collection/handling, submission of specimen other than nasopharyngeal swab, presence of viral mutation(s) within the areas targeted by this assay, and inadequate number of viral copies(<138 copies/mL). A negative result must be combined with clinical observations, patient history, and epidemiological information. The expected result is Negative.  Fact Sheet for Patients:  EntrepreneurPulse.com.au  Fact Sheet for Healthcare Providers:  IncredibleEmployment.be  This test is no t yet approved or cleared by the [place] FDA and  has been authorized for detection and/or diagnosis of SARS-CoV-2 by FDA under an Emergency Use Authorization (EUA). This EUA will remain  in effect (meaning this test can be used) for the duration of the COVID-19 declaration under Section 564(b)(1) of the Act, 21 U.S.C.section 360bbb-3(b)(1), unless the authorization is terminated  or revoked sooner.       Influenza A by PCR NEGATIVE NEGATIVE Final   Influenza B by PCR NEGATIVE NEGATIVE Final    Comment: (NOTE) The Xpert Xpress SARS-CoV-2/FLU/RSV plus  assay is intended as an aid in the diagnosis of influenza from Nasopharyngeal swab specimens and should not be used as a sole basis for treatment. Nasal washings and aspirates are unacceptable for Xpert Xpress SARS-CoV-2/FLU/RSV testing.  Fact Sheet for  Patients: BloggerCourse.com  Fact Sheet for Healthcare Providers: SeriousBroker.it  This test is not yet approved or cleared by the [place] FDA and has been authorized for detection and/or diagnosis of SARS-CoV-2 by FDA under an Emergency Use Authorization (EUA). This EUA will remain in effect (meaning this test can be used) for the duration of the COVID-19 declaration under Section 564(b)(1) of the Act, 21 U.S.C. section 360bbb-3(b)(1), unless the authorization is terminated or revoked.     Resp Syncytial Virus by PCR NEGATIVE NEGATIVE Final    Comment: (NOTE) Fact Sheet for Patients: BloggerCourse.com  Fact Sheet for Healthcare Providers: SeriousBroker.it  This test is not yet approved or cleared by the [place] FDA and has been authorized for detection and/or diagnosis of SARS-CoV-2 by FDA under an Emergency Use Authorization (EUA). This EUA will remain in effect (meaning this test can be used) for the duration of the COVID-19 declaration under Section 564(b)(1) of the Act, 21 U.S.C. section 360bbb-3(b)(1), unless the authorization is terminated or revoked.  Performed at [facility], [address] [place], [place] [address]     Labs: CBC: Recent Labs  Lab 09/13/22 2220 09/14/22 0520 09/16/22 0532 09/17/22 0341  WBC 14.3* 13.2* 11.3* 10.9*  NEUTROABS 11.6* 10.2*  --   --   HGB 17.0 16.1 14.8 14.4  HCT 52.6* 49.6 47.1 44.9  MCV 93.9 93.2 95.5 93.7  PLT 189 166 152 150   Basic Metabolic Panel: Recent Labs  Lab 09/13/22 2220 09/14/22 0520 09/15/22 0338 09/16/22 0710 09/17/22 0341  NA 135 136 137 135 132*  K 3.6 3.3* 3.9 3.8 3.7  CL 96* 98 98 99 100  CO2 24 28 30 26 24   GLUCOSE 197* 136* 134* 112* 130*  BUN 22 19 17 14 12   CREATININE 1.10 0.86 0.82 0.76 0.70  CALCIUM 9.7 9.6 9.7 8.9 9.0  MG  --  1.5* 2.1 1.7 1.8   Liver Function  Tests: Recent Labs  Lab 09/13/22 2220 09/14/22 0520 09/17/22 1758  AST 33 23 26  ALT 32 30 17  ALKPHOS 69 65 67  BILITOT 0.8 0.7 0.8  PROT 6.6 6.5 6.3*  ALBUMIN 3.3* 3.1* 2.9*   CBG: No results for input(s): "GLUCAP" in the last 168 hours.  Discharge time spent: greater than 30 minutes.  Signed: 11/13/22, MD [facility] 09/18/2022

## 2022-09-18 NOTE — Progress Notes (Signed)
Took over care of patient at 1530. Patient eating dinner at this time. Call bell within reach.

## 2022-09-18 NOTE — TOC Progression Note (Signed)
Transition of Care [facility]) - Progression Note    Patient Details  Name: Alexander Johnson MRN: [id number] Date of Birth: 1946/05/25  Transition of Care [facility] CM/SW [place], [place] Phone Number: 09/18/2022, 11:44 AM  Clinical Narrative:    CSW spoke with pts daughter who states that family preference is [facility]. Daughter states she spoke with SW at [facility] Thursday and they stated they had a bed for pt. TOC at [facility] has not heard from [facility]. CSW left VM for admissions requesting call back. TOC to follow.   Expected Discharge Plan:  (unsure) Barriers to Discharge: Continued Medical Work up  Expected Discharge Plan and Services In-house Referral: Clinical Social Work     Living arrangements for the past 2 months: Single Family Home Expected Discharge Date: 09/18/22                         HH Arranged: RN, PT, OT, Social Work [employer] Agency: [place] Date [facility] Agency Contacted: 09/15/22 Time [place]: [id number] Representative spoke with at [place]: [place] Interventions SDOH Screenings   Food Insecurity: No Food Insecurity (09/14/2022)  Housing: Low Risk  (09/14/2022)  Transportation Needs: No Transportation Needs (09/14/2022)  Utilities: Not At Risk (09/14/2022)  Tobacco Use: Medium Risk (09/13/2022)    Readmission Risk Interventions     No data to display

## 2022-09-18 NOTE — Progress Notes (Signed)
Patient rested well during this shift. CPAP placed by RT Robert.

## 2022-09-18 NOTE — Progress Notes (Signed)
Have tried twice once at 23;30 again at 01:40 to place on night time CPAP patient still watching TV.

## 2022-09-19 DIAGNOSIS — N179 Acute kidney failure, unspecified: Secondary | ICD-10-CM | POA: Diagnosis not present

## 2022-09-19 DIAGNOSIS — R627 Adult failure to thrive: Secondary | ICD-10-CM | POA: Diagnosis not present

## 2022-09-19 DIAGNOSIS — L03115 Cellulitis of right lower limb: Secondary | ICD-10-CM | POA: Diagnosis not present

## 2022-09-19 DIAGNOSIS — R531 Weakness: Secondary | ICD-10-CM | POA: Diagnosis not present

## 2022-09-19 LAB — URINE CULTURE: Culture: NO GROWTH

## 2022-09-19 NOTE — Plan of Care (Signed)
  Problem: Education: Goal: Knowledge of General Education information will improve Description: Including pain rating scale, medication(s)/side effects and non-pharmacologic comfort measures Outcome: Progressing   Problem: Health Behavior/Discharge Planning: Goal: Ability to manage health-related needs will improve Outcome: Progressing   Problem: Activity: Goal: Risk for activity intolerance will decrease Outcome: Progressing   Problem: Nutrition: Goal: Adequate nutrition will be maintained Outcome: Progressing   Problem: Elimination: Goal: Will not experience complications related to bowel motility Outcome: Progressing Goal: Will not experience complications related to urinary retention Outcome: Progressing   Problem: Pain Managment: Goal: General experience of comfort will improve Outcome: Progressing   Problem: Safety: Goal: Ability to remain free from injury will improve Outcome: Progressing   Problem: Skin Integrity: Goal: Risk for impaired skin integrity will decrease Outcome: Progressing   

## 2022-09-19 NOTE — Progress Notes (Signed)
PROGRESS NOTE  Alexander Johnson [id number] DOB: 1946-08-22 DOA: 09/13/2022 PCP: Clinton Quant, MD  Brief History:  77 year old male with a history of atrial fibrillation, COPD, hypertension, hyperlipidemia, polio with left lower extremity weakness presenting with generalized weakness.  The patient is a poor historian, but states that he is weaker than usual.  Daughter assists with the history.  She states that the patient has been progressively weaker since Thanksgiving.  He has had decreased oral intake since Christmas.  Because of the weakness, the patient has had multiple falls.  At baseline, the patient was able to ambulate with a walker.  However, his weakness has progressed to the point where he needed assistance of 3 people to get out of bed.  The patient has been falling but denies hitting his head or losing consciousness.  He denies any fevers, chills, chest pain, shortness breath, nausea, vomiting, diarrhea.  In the ED, the patient had WBC 14.3, hemoglobin 17.0, platelets 189,000.  Serum creatinine was 1.10.  The patient was started on IV fluids.  He was admitted for further evaluation and treatment of his generalized weakness.   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 -ambulatory referral to neuromuscular specialist r/o post-polio syndrome vs other neuromuscular disorder (daughter endorses occasional bulbar type speech)   AKI -Secondary to volume depletion -Baseline  creatinine 0.7-0.8 -Presented with serum creatinine 1.10 -Improved with IV fluid--continue -serum creatinine 0.70 at the time of d/c   Scleritis -started on prednisone 60 mg daily inpatient>>improving -wean back to baseline prednisone 12.5 mg daily over the next week -trying to avoid NSAIDS with DOAC   Delirium -due to hospital delirium with sundowning -personally reviewed EKG--afib, nonspecific STT change -personally reviewed CXR--no edema, no infiltrates -work up as above -check VBG 7.4/46/32/28 -ammonia <10 -change to unasyn to cover E faecalis in urine -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 underlying MCI   Mixed hyperlipidemia -Continue statin   Rheumatoid arthritis--seronegative -Continue Plaquenil   Essential hypertension -Continue metoprolol tartrate   Atrial fibrillation, type unspecified -Rate controlled -Continue metoprolol tartrate -Continue rivaroxaban   Essential HTN -continue metoprolol tartrate      Family Communication:   daughter updated 1/14  Consultants:  none  Code Status:  FULL  DVT Prophylaxis:  Xarelto   Procedures: As Listed in Progress Note Above  Antibiotics: Amoxil 1/14>> Unasyn 1/12>>1/13      Subjective:  Patient denies fevers, chills, headache, chest pain, dyspnea, nausea, vomiting, diarrhea, abdominal pain, dysuria, hematuria, hematochezia, and melena.   Objective: Vitals:   09/18/22 2016 09/19/22 0024 09/19/22 0505 09/19/22 0736  BP:   118/82   Pulse:  87 88   Resp:  20 20   Temp:   (!) 97.4 F (36.3 C)   TempSrc:   Oral   SpO2: 98% 97% 99% 97%  Weight:      Height:        Intake/Output Summary (Last 24 hours) at 09/19/2022 0941 Last data filed at 09/19/2022 0543 Gross per 24 hour  Intake 780 ml  Output 1500 ml  Net -720 ml  Weight change:  Exam:  General:  Pt is alert, follows commands appropriately, not in acute distress HEENT: No icterus, No thrush, No neck mass, [place]/AT;  sclera non-injected Cardiovascular:  RRR, S1/S2, no rubs, no gallops Respiratory: bibasilar crackles. No wheeze Abdomen: Soft/+BS, non tender, non distended, no guarding Extremities: No edema, No lymphangitis, No petechiae, No rashes, no synovitis;  mild erythema bilateral LE   Data Reviewed: I have personally reviewed following labs and imaging studies Basic Metabolic Panel: Recent Labs  Lab 09/13/22 2220 09/14/22 0520 09/15/22 0338 09/16/22 0710 09/17/22 0341  NA 135 136 137 135 132*  K 3.6 3.3* 3.9 3.8 3.7  CL 96* 98 98 99 100  CO2 24 28 30 26 24   GLUCOSE 197* 136* 134* 112* 130*  BUN 22 19 17 14 12   CREATININE 1.10 0.86 0.82 0.76 0.70  CALCIUM 9.7 9.6 9.7 8.9 9.0  MG  --  1.5* 2.1 1.7 1.8   Liver Function Tests: Recent Labs  Lab 09/13/22 2220 09/14/22 0520 09/17/22 1758  AST 33 23 26  ALT 32 30 17  ALKPHOS 69 65 67  BILITOT 0.8 0.7 0.8  PROT 6.6 6.5 6.3*  ALBUMIN 3.3* 3.1* 2.9*   No results for input(s): "LIPASE", "AMYLASE" in the last 168 hours. Recent Labs  Lab 09/17/22 1758  AMMONIA <10   Coagulation Profile: No results for input(s): "INR", "PROTIME" in the last 168 hours. CBC: Recent Labs  Lab 09/13/22 2220 09/14/22 0520 09/16/22 0532 09/17/22 0341  WBC 14.3* 13.2* 11.3* 10.9*  NEUTROABS 11.6* 10.2*  --   --   HGB 17.0 16.1 14.8 14.4  HCT 52.6* 49.6 47.1 44.9  MCV 93.9 93.2 95.5 93.7  PLT 189 166 152 150   Cardiac Enzymes: Recent Labs  Lab 09/13/22 2220  CKTOTAL 31*   BNP: Invalid input(s): "POCBNP" CBG: No results for input(s): "GLUCAP" in the last 168 hours. HbA1C: No results for input(s): "HGBA1C" in the last 72 hours. Urine analysis:    Component Value Date/Time   COLORURINE YELLOW 09/17/2022 1930   APPEARANCEUR CLEAR 09/17/2022 1930   LABSPEC 1.027 09/17/2022 1930   PHURINE 6.0 09/17/2022 1930   GLUCOSEU >=500 (A) 09/17/2022 1930   HGBUR NEGATIVE 09/17/2022 1930   BILIRUBINUR NEGATIVE 09/17/2022 1930   KETONESUR NEGATIVE 09/17/2022 1930   PROTEINUR NEGATIVE  09/17/2022 1930   NITRITE NEGATIVE 09/17/2022 1930   LEUKOCYTESUR TRACE (A) 09/17/2022 1930   Sepsis Labs: @LABRCNTIP (procalcitonin:4,lacticidven:4) ) Recent Results (from the past 240 hour(s))  Urine Culture     Status: Abnormal   Collection Time: 09/14/22 12:45 AM   Specimen: Urine, Clean Catch  Result Value Ref Range Status   Specimen Description   Final    URINE, CLEAN CATCH Performed at [facility], [address], [place], [place] [address]    Special Requests   Final    NONE Performed at [facility], [address] [place], [place] [address]    Culture 20,000 COLONIES/mL ENTEROCOCCUS FAECALIS (A)  Final   Report Status 09/16/2022 FINAL  Final   Organism ID, Bacteria ENTEROCOCCUS FAECALIS (A)  Final      Susceptibility   Enterococcus faecalis - MIC*    AMPICILLIN <=2 SENSITIVE Sensitive     NITROFURANTOIN <=16 SENSITIVE Sensitive     VANCOMYCIN 1 SENSITIVE Sensitive     * 20,000 COLONIES/mL ENTEROCOCCUS FAECALIS  Resp panel by RT-PCR (RSV, Flu A&B, Covid) Anterior Nasal Swab     Status: None   Collection Time: 09/15/22  2:05  PM   Specimen: Anterior Nasal Swab  Result Value Ref Range Status   SARS Coronavirus 2 by RT PCR NEGATIVE NEGATIVE Final    Comment: (NOTE) SARS-CoV-2 target nucleic acids are NOT DETECTED.  The SARS-CoV-2 RNA is generally detectable in upper respiratory specimens during the acute phase of infection. The lowest concentration of SARS-CoV-2 viral copies this assay can detect is 138 copies/mL. A negative result does not preclude SARS-Cov-2 infection and should not be used as the sole basis for treatment or other patient management decisions. A negative result may occur with  improper specimen collection/handling, submission of specimen other than nasopharyngeal swab, presence of viral mutation(s) within the areas targeted by this assay, and inadequate number of viral copies(<138 copies/mL). A negative result must be combined with clinical  observations, patient history, and epidemiological information. The expected result is Negative.  Fact Sheet for Patients:  EntrepreneurPulse.com.au  Fact Sheet for Healthcare Providers:  IncredibleEmployment.be  This test is no t yet approved or cleared by the [place] FDA and  has been authorized for detection and/or diagnosis of SARS-CoV-2 by FDA under an Emergency Use Authorization (EUA). This EUA will remain  in effect (meaning this test can be used) for the duration of the COVID-19 declaration under Section 564(b)(1) of the Act, 21 U.S.C.section 360bbb-3(b)(1), unless the authorization is terminated  or revoked sooner.       Influenza A by PCR NEGATIVE NEGATIVE Final   Influenza B by PCR NEGATIVE NEGATIVE Final    Comment: (NOTE) The Xpert Xpress SARS-CoV-2/FLU/RSV plus assay is intended as an aid in the diagnosis of influenza from Nasopharyngeal swab specimens and should not be used as a sole basis for treatment. Nasal washings and aspirates are unacceptable for Xpert Xpress SARS-CoV-2/FLU/RSV testing.  Fact Sheet for Patients: EntrepreneurPulse.com.au  Fact Sheet for Healthcare Providers: IncredibleEmployment.be  This test is not yet approved or cleared by the [place] FDA and has been authorized for detection and/or diagnosis of SARS-CoV-2 by FDA under an Emergency Use Authorization (EUA). This EUA will remain in effect (meaning this test can be used) for the duration of the COVID-19 declaration under Section 564(b)(1) of the Act, 21 U.S.C. section 360bbb-3(b)(1), unless the authorization is terminated or revoked.     Resp Syncytial Virus by PCR NEGATIVE NEGATIVE Final    Comment: (NOTE) Fact Sheet for Patients: EntrepreneurPulse.com.au  Fact Sheet for Healthcare Providers: IncredibleEmployment.be  This test is not yet approved or cleared by  the [place] FDA and has been authorized for detection and/or diagnosis of SARS-CoV-2 by FDA under an Emergency Use Authorization (EUA). This EUA will remain in effect (meaning this test can be used) for the duration of the COVID-19 declaration under Section 564(b)(1) of the Act, 21 U.S.C. section 360bbb-3(b)(1), unless the authorization is terminated or revoked.  Performed at [facility], [address], [place], [place] [address]      Scheduled Meds:  amoxicillin-clavulanate  1 tablet Oral Q12H   hydroxychloroquine  400 mg Oral Daily   magnesium oxide  400 mg Oral Daily   metoprolol tartrate  100 mg Oral BID   mometasone-formoterol  2 puff Inhalation BID   pantoprazole  40 mg Oral Daily   polyvinyl alcohol  2 drop Right Eye BID   predniSONE  40 mg Oral Q breakfast   rivaroxaban  20 mg Oral Daily   rosuvastatin  10 mg Oral Daily   venlafaxine XR  150 mg Oral Daily   Continuous Infusions:  Procedures/Studies: DG  CHEST PORT 1 VIEW  Result Date: 09/17/2022 CLINICAL DATA:  Hypoxia.  Confusion. EXAM: PORTABLE CHEST 1 VIEW COMPARISON:  09/13/2022 FINDINGS: Mild enlargement of the cardiopericardial silhouette. No edema. Apical lordotic projection. The lungs appear clear. No blunting of the costophrenic angles. Degenerative glenohumeral arthropathy bilaterally. IMPRESSION: 1. Mild enlargement of the cardiopericardial silhouette, without edema. 2. Degenerative glenohumeral arthropathy bilaterally. Electronically Signed   By: Van Clines M.D.   On: 09/17/2022 16:31   MR CERVICAL SPINE WO CONTRAST  Result Date: 09/17/2022 CLINICAL DATA:  History of polio.  Now with generalized weakness. EXAM: MRI CERVICAL SPINE WITHOUT CONTRAST TECHNIQUE: Multiplanar, multisequence MR imaging of the cervical spine was performed. No intravenous contrast was administered. COMPARISON:  None Available. FINDINGS: Alignment: Reversal of the cervical curvature. Small anterolisthesis of C2 over C3.  Vertebrae: No fracture, evidence of discitis, or bone lesion. Partial fusion of the C3-4 endplates and left facet joint. Cord: Mild mass effect on the cord at C4-5, C5-6 and C6-7. The cord appear flattened and with decreased volume along at these levels increased T2 signal centrally in the cord at the C3-4 and C4-5 levels on axial T2 images. Posterior Fossa, vertebral arteries, paraspinal tissues: Negative. Disc levels: C2-3: Minimal disc bulge without significant spinal canal stenosis. Uncovertebral and facet degenerative changes resulting in mild right and severe left neural foraminal narrowing. C3-4: Posterior disc osteophyte complex resulting in mild spinal canal stenosis. Uncovertebral and facet degenerative change resulting in severe bilateral neural foraminal narrowing. C4-5: Posterior disc osteophyte complex asymmetric to the right causing mild mass effect on the cord and resulting in moderate spinal canal stenosis. Uncovertebral and facet degenerative changes resulting in severe right and moderate left neural foraminal narrowing. C5-6: Posterior disc osteophyte complex resulting in mild spinal canal stenosis mild mass effect on cord. Uncovertebral and facet degenerative changes resulting in severe bilateral neural foraminal narrowing. C6-7: Posterior disc osteophyte complex with mild mass effect on the cord, without significant spinal canal stenosis. Uncovertebral and facet degenerative changes resulting in moderate right and severe left neural. C7-T1: Posterior disc osteophyte without significant spinal canal stenosis. Uncovertebral and facet degenerative changes resulting in severe bilateral neural. IMPRESSION: 1. 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 2. High-grade bilateral neural foraminal narrowing from C3-4 through C7-T1. Electronically Signed   By: Pedro Earls M.D.   On: 09/17/2022 09:00   MR  BRAIN WO CONTRAST  Result Date: 09/17/2022 CLINICAL DATA:  Neuro deficit, acute, stroke suspected. Failure to thrive. Generalized weakness. EXAM: MRI HEAD WITHOUT CONTRAST TECHNIQUE: Multiplanar, multiecho pulse sequences of the brain and surrounding structures were obtained without intravenous contrast. COMPARISON:  Head CT September 13, 2022. FINDINGS: Brain: No acute infarction, hemorrhage, hydrocephalus, extra-axial collection or mass lesion. 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 Mild patchy T2 hyperintensity within the pons, nonspecific, most likely related to chronic small vessel ischemia. Mild parenchymal volume loss. Vascular: Normal flow voids. Skull and upper cervical spine: Normal marrow signal. Sinuses/Orbits: Negative. Other: None. IMPRESSION: 1. No acute intracranial abnormality. 2. 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 3. Mild chronic microvascular ischemic changes of the white matter and parenchymal volume loss. Electronically Signed   By: Pedro Earls M.D.   On: 09/17/2022 08:46   US Venous Img Lower Unilateral Right (DVT)  Result Date: 09/14/2022 CLINICAL DATA:  Edema EXAM: RIGHT LOWER EXTREMITY  VENOUS DOPPLER ULTRASOUND TECHNIQUE: Gray-scale sonography with compression, as well as color and duplex ultrasound, were performed to evaluate the deep venous system(s) from the level of the common femoral vein through the popliteal and proximal calf veins. COMPARISON:  None Available. FINDINGS: VENOUS Normal compressibility of the common femoral, superficial femoral, and popliteal veins, as well as the visualized calf veins. Visualized portions of profunda femoral vein and great saphenous vein unremarkable. No filling defects to suggest DVT on grayscale or color Doppler imaging. Doppler waveforms show normal direction of venous flow, normal respiratory plasticity and response to augmentation. Limited  views of the contralateral common femoral vein are unremarkable. OTHER None. Limitations: none IMPRESSION: No evidence of DVT in the right lower extremity. Electronically Signed   By: Caprice Renshaw M.D.   On: 09/14/2022 09:07   CT Head Wo Contrast  Result Date: 09/13/2022 CLINICAL DATA:  Neuro deficit, acute, stroke suspected EXAM: CT HEAD WITHOUT CONTRAST TECHNIQUE: Contiguous axial images were obtained from the base of the skull through the vertex without intravenous contrast. RADIATION DOSE REDUCTION: This exam was performed according to the departmental dose-optimization program which includes automated exposure control, adjustment of the mA and/or kV according to patient size and/or use of iterative reconstruction technique. COMPARISON:  None Available. FINDINGS: Brain: Cerebral ventricle sizes are concordant with the degree of cerebral volume loss. No evidence of large-territorial acute infarction. No parenchymal hemorrhage. No mass lesion. No extra-axial collection. No mass effect or midline shift. No hydrocephalus. Basilar cisterns are patent. Vascular: No hyperdense vessel. Skull: No acute fracture or focal lesion. Sinuses/Orbits: Paranasal sinuses and mastoid air cells are clear. The orbits are unremarkable. Other: None. IMPRESSION: No acute intracranial abnormality. Electronically Signed   By: Tish Frederickson M.D.   On: 09/13/2022 23:42   DG Chest Portable 1 View  Result Date: 09/13/2022 CLINICAL DATA:  Weakness and multiple falls since Thanksgiving EXAM: PORTABLE CHEST 1 VIEW COMPARISON:  07/28/2021 FINDINGS: Cardiomegaly. Aortic atherosclerotic calcification. No focal consolidation, pleural effusion, or pneumothorax. No acute osseous abnormality. IMPRESSION: No active disease.  Cardiomegaly. Electronically Signed   By: Minerva Fester M.D.   On: 09/13/2022 23:37    Catarina Hartshorn, DO  [facility]  If 7PM-7AM, please contact night-coverage [email or web address] Password [facility] 09/19/2022, 9:41 AM    LOS: 4 days

## 2022-09-20 DIAGNOSIS — N179 Acute kidney failure, unspecified: Secondary | ICD-10-CM | POA: Diagnosis not present

## 2022-09-20 DIAGNOSIS — R531 Weakness: Secondary | ICD-10-CM | POA: Diagnosis not present

## 2022-09-20 DIAGNOSIS — R627 Adult failure to thrive: Secondary | ICD-10-CM | POA: Diagnosis not present

## 2022-09-20 DIAGNOSIS — L03115 Cellulitis of right lower limb: Secondary | ICD-10-CM | POA: Diagnosis not present

## 2022-09-20 MED ORDER — AMOXICILLIN-POT CLAVULANATE 875-125 MG PO TABS: 1.0000 | ORAL_TABLET | Freq: Two times a day (BID) | ORAL | Status: AC

## 2022-09-20 NOTE — Discharge Summary (Signed)
Physician Discharge Summary   Patient: Alexander Johnson MRN: [id number] DOB: 1945/11/17  Admit date:     09/13/2022  Discharge date: 09/20/22  Discharge Physician: Onalee Hua Lorri Fukuhara   PCP: Eldridge Abrahams, MD   Recommendations at discharge:   Please follow up with primary care provider within 1-2 weeks  Please repeat BMP and CBC in one week   Discharge Diagnoses: Principal Problem:   Generalized weakness Active Problems:   Atrial fibrillation (HCC)   Hx of post-polio syndrome   HTN (hypertension)   COPD (chronic obstructive pulmonary disease) (HCC)   OSA on CPAP   Hyperlipidemia   Failure to thrive in adult   AKI (acute kidney injury) (HCC)   Cellulitis and abscess of right leg   Cellulitis of leg  Resolved Problems:   * No resolved hospital problems. *  Hospital Course: 77 year old male with a history of atrial fibrillation, COPD, hypertension, hyperlipidemia, polio with left lower extremity weakness presenting with generalized weakness.  The patient is a poor historian, but states that he is weaker than usual.  Daughter assists with the history.  She states that the patient has been progressively weaker since Thanksgiving.  He has had decreased oral intake since Christmas.  Because of the weakness, the patient has had multiple falls.  At baseline, the patient was able to ambulate with a walker.  However, his weakness has progressed to the point where he needed assistance of 3 people to get out of bed.  The patient has been falling but denies hitting his head or losing consciousness.  He denies any fevers, chills, chest pain, shortness breath, nausea, vomiting, diarrhea.  In the ED, the patient had WBC 14.3, hemoglobin 17.0, platelets 189,000.  Serum creatinine was 1.10.  The patient was started on IV fluids.  He was admitted for further evaluation and treatment of his generalized weakness. He was treated for UTI and cellulitis with clinical improvement.  He continued to have  generalized weakness and remained deconditioned.  PT recommended SNF.  His discharge was delayed due to family preference for SNF and [facility] team logistical issues.  Assessment and Plan: Cellulitis right lower extremity -Patient has failed outpatient oral antibiotics -continue IV unasyn>>d/c with amox/clav x 4 more days -This may be partly contributing to his worsening generalized weakness -overall improving   Failure to thrive/generalized weakness -UA negative for pyuria -Folic acid 14.2 -B12---555 -TSH 2.622 -CPK 31 -PT evaluation>>SNF -continued IVF>>saline locked when po intake improved -MR brain--no acute findings -MR C-spine--multilevel advanced degenerative changes with mod spinal cord stenosis -ambulatory referral to neuromuscular specialist r/o post-polio syndrome vs CIDP vs other neuromuscular disorder (daughter endorses occasional bulbar type speech)   AKI -Secondary to volume depletion -Baseline creatinine 0.7-0.8 -Presented with serum creatinine 1.10 -Improved with IV fluid--continue -serum creatinine 0.70 at the time of d/c   Scleritis -started on prednisone 60 mg daily inpatient>>improving -wean back to baseline prednisone 12.5 mg daily over the next week -avoiding NSAIDS with DOAC   Delirium -due to hospital delirium with sundowning -personally reviewed EKG--afib, nonspecific STT change -personally reviewed CXR--no edema, no infiltrates -work up as above unremarkable -check VBG 7.4/46/32/28 -ammonia <10 -change to unasyn to cover E faecalis in urine -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 underlying MCI   Mixed hyperlipidemia -Continue statin   Rheumatoid arthritis--seronegative -Continue Plaquenil   Essential hypertension -Continue metoprolol tartrate   Atrial fibrillation, type unspecified -Rate controlled -Continue metoprolol tartrate -Continue rivaroxaban   Essential HTN -continue metoprolol tartrate  Consultants: none Procedures performed: none   Disposition: Skilled nursing facility Diet recommendation:  Carb modified diet DISCHARGE MEDICATION: Allergies as of 09/20/2022       Reactions   Metformin Diarrhea        Medication List     TAKE these medications    albuterol 108 (90 Base) MCG/ACT inhaler Commonly known as: VENTOLIN HFA Inhale into the lungs.   amoxicillin-clavulanate 875-125 MG tablet Commonly known as: AUGMENTIN Take 1 tablet by mouth every 12 (twelve) hours. X 4 days   Cholecalciferol 50 MCG (2000 UT) Caps Take 1 capsule by mouth daily.   docusate sodium 100 MG capsule Commonly known as: COLACE Take 100 mg by mouth daily.   fluticasone 50 MCG/ACT nasal spray Commonly known as: FLONASE Place 2 sprays into both nostrils daily as needed for allergies.   Gemtesa 75 MG Tabs Generic drug: Vibegron Take 1 tablet by mouth daily.   Jardiance 10 MG Tabs tablet Generic drug: empagliflozin Take 10 mg by mouth daily.   metoprolol tartrate 100 MG tablet Commonly known as: LOPRESSOR Take 100 mg by mouth 2 (two) times daily.   pantoprazole 40 MG tablet Commonly known as: PROTONIX Take 40 mg by mouth daily.   Plaquenil 200 MG tablet Generic drug: hydroxychloroquine Take 400 mg by mouth daily.   predniSONE 20 MG tablet Commonly known as: DELTASONE Take 2 tablets (40 mg total) by mouth daily with breakfast. X 2 days, then 30 mg daily x 2 days, then 20 mg daily x 2 days, then 15 mg daily x 2 days, then back to 12.5 mg daily What changed:  medication strength how much to take when to take this additional instructions   rivaroxaban 20 MG Tabs tablet Commonly known as: XARELTO Take 20 mg by mouth daily.   rosuvastatin 10 MG tablet Commonly known as: CRESTOR Take 10 mg by mouth daily.   spironolactone 25 MG tablet Commonly known as: ALDACTONE Take 25 mg by mouth daily.   Symbicort 160-4.5 MCG/ACT inhaler Generic drug: budesonide-formoterol Inhale 2 puffs into the lungs 2 (two) times  daily.   torsemide 10 MG tablet Commonly known as: DEMADEX Take 10 mg by mouth daily.   venlafaxine XR 75 MG 24 hr capsule Commonly known as: EFFEXOR-XR Take 150 mg by mouth daily.        Discharge Exam: Filed Weights   09/13/22 2159  Weight: 97.5 kg   HEENT:  [place]/AT, No thrush, no icterus CV:  IRRR, no rub, no S3, no S4 Lung:  fine bibasilar crackles.  No wheeze Abd:  soft/+BS, NT Ext:  No edema, no lymphangitis, no synovitis, mild erythema bilateral LE from ankle to upper tibia without drainage, necrosis.  Condition at discharge: stable  The results of significant diagnostics from this hospitalization (including imaging, microbiology, ancillary and laboratory) are listed below for reference.   Imaging Studies: DG CHEST PORT 1 VIEW  Result Date: 09/17/2022 CLINICAL DATA:  Hypoxia.  Confusion. EXAM: PORTABLE CHEST 1 VIEW COMPARISON:  09/13/2022 FINDINGS: Mild enlargement of the cardiopericardial silhouette. No edema. Apical lordotic projection. The lungs appear clear. No blunting of the costophrenic angles. Degenerative glenohumeral arthropathy bilaterally. IMPRESSION: 1. Mild enlargement of the cardiopericardial silhouette, without edema. 2. Degenerative glenohumeral arthropathy bilaterally. Electronically Signed   By: Gaylyn Rong M.D.   On: 09/17/2022 16:31   MR CERVICAL SPINE WO CONTRAST  Result Date: 09/17/2022 CLINICAL DATA:  History of polio.  Now with generalized weakness. EXAM: MRI CERVICAL SPINE WITHOUT CONTRAST TECHNIQUE:  Multiplanar, multisequence MR imaging of the cervical spine was performed. No intravenous contrast was administered. COMPARISON:  None Available. FINDINGS: Alignment: Reversal of the cervical curvature. Small anterolisthesis of C2 over C3. Vertebrae: No fracture, evidence of discitis, or bone lesion. Partial fusion of the C3-4 endplates and left facet joint. Cord: Mild mass effect on the cord at C4-5, C5-6 and C6-7. The cord appear flattened and  with decreased volume along at these levels increased T2 signal centrally in the cord at the C3-4 and C4-5 levels on axial T2 images. Posterior Fossa, vertebral arteries, paraspinal tissues: Negative. Disc levels: C2-3: Minimal disc bulge without significant spinal canal stenosis. Uncovertebral and facet degenerative changes resulting in mild right and severe left neural foraminal narrowing. C3-4: Posterior disc osteophyte complex resulting in mild spinal canal stenosis. Uncovertebral and facet degenerative change resulting in severe bilateral neural foraminal narrowing. C4-5: Posterior disc osteophyte complex asymmetric to the right causing mild mass effect on the cord and resulting in moderate spinal canal stenosis. Uncovertebral and facet degenerative changes resulting in severe right and moderate left neural foraminal narrowing. C5-6: Posterior disc osteophyte complex resulting in mild spinal canal stenosis mild mass effect on cord. Uncovertebral and facet degenerative changes resulting in severe bilateral neural foraminal narrowing. C6-7: Posterior disc osteophyte complex with mild mass effect on the cord, without significant spinal canal stenosis. Uncovertebral and facet degenerative changes resulting in moderate right and severe left neural. C7-T1: Posterior disc osteophyte without significant spinal canal stenosis. Uncovertebral and facet degenerative changes resulting in severe bilateral neural. IMPRESSION: 1. 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 2. High-grade bilateral neural foraminal narrowing from C3-4 through C7-T1. Electronically Signed   By: Baldemar Lenis M.D.   On: 09/17/2022 09:00   MR BRAIN WO CONTRAST  Result Date: 09/17/2022 CLINICAL DATA:  Neuro deficit, acute, stroke suspected. Failure to thrive. Generalized weakness. EXAM: MRI HEAD WITHOUT CONTRAST TECHNIQUE: Multiplanar, multiecho pulse  sequences of the brain and surrounding structures were obtained without intravenous contrast. COMPARISON:  Head CT September 13, 2022. FINDINGS: Brain: No acute infarction, hemorrhage, hydrocephalus, extra-axial collection or mass lesion. 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 Mild patchy T2 hyperintensity within the pons, nonspecific, most likely related to chronic small vessel ischemia. Mild parenchymal volume loss. Vascular: Normal flow voids. Skull and upper cervical spine: Normal marrow signal. Sinuses/Orbits: Negative. Other: None. IMPRESSION: 1. No acute intracranial abnormality. 2. 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 3. Mild chronic microvascular ischemic changes of the white matter and parenchymal volume loss. Electronically Signed   By: Baldemar Lenis M.D.   On: 09/17/2022 08:46   US Venous Img Lower Unilateral Right (DVT)  Result Date: 09/14/2022 CLINICAL DATA:  Edema EXAM: RIGHT LOWER EXTREMITY VENOUS DOPPLER ULTRASOUND TECHNIQUE: Gray-scale sonography with compression, as well as color and duplex ultrasound, were performed to evaluate the deep venous system(s) from the level of the common femoral vein through the popliteal and proximal calf veins. COMPARISON:  None Available. FINDINGS: VENOUS Normal compressibility of the common femoral, superficial femoral, and popliteal veins, as well as the visualized calf veins. Visualized portions of profunda femoral vein and great saphenous vein unremarkable. No filling defects to suggest DVT on grayscale or color Doppler imaging. Doppler waveforms show normal direction of venous flow, normal respiratory plasticity and response to augmentation. Limited views of the contralateral common  femoral vein are unremarkable. OTHER None. Limitations: none IMPRESSION: No evidence of DVT in the right lower extremity. Electronically Signed   By: Maurine Simmering M.D.   On: 09/14/2022  09:07   CT Head Wo Contrast  Result Date: 09/13/2022 CLINICAL DATA:  Neuro deficit, acute, stroke suspected EXAM: CT HEAD WITHOUT CONTRAST TECHNIQUE: Contiguous axial images were obtained from the base of the skull through the vertex without intravenous contrast. RADIATION DOSE REDUCTION: This exam was performed according to the departmental dose-optimization program which includes automated exposure control, adjustment of the mA and/or kV according to patient size and/or use of iterative reconstruction technique. COMPARISON:  None Available. FINDINGS: Brain: Cerebral ventricle sizes are concordant with the degree of cerebral volume loss. No evidence of large-territorial acute infarction. No parenchymal hemorrhage. No mass lesion. No extra-axial collection. No mass effect or midline shift. No hydrocephalus. Basilar cisterns are patent. Vascular: No hyperdense vessel. Skull: No acute fracture or focal lesion. Sinuses/Orbits: Paranasal sinuses and mastoid air cells are clear. The orbits are unremarkable. Other: None. IMPRESSION: No acute intracranial abnormality. Electronically Signed   By: Iven Finn M.D.   On: 09/13/2022 23:42   DG Chest Portable 1 View  Result Date: 09/13/2022 CLINICAL DATA:  Weakness and multiple falls since Thanksgiving EXAM: PORTABLE CHEST 1 VIEW COMPARISON:  07/28/2021 FINDINGS: Cardiomegaly. Aortic atherosclerotic calcification. No focal consolidation, pleural effusion, or pneumothorax. No acute osseous abnormality. IMPRESSION: No active disease.  Cardiomegaly. Electronically Signed   By: Placido Sou M.D.   On: 09/13/2022 23:37    Microbiology: Results for orders placed or performed during the hospital encounter of 09/13/22  Urine Culture     Status: Abnormal   Collection Time: 09/14/22 12:45 AM   Specimen: Urine, Clean Catch  Result Value Ref Range Status   Specimen Description   Final    URINE, CLEAN CATCH Performed at [facility], [address], [place],  [place] [address]    Special Requests   Final    NONE Performed at [facility], [address] [place], [place] [address]    Culture 20,000 COLONIES/mL ENTEROCOCCUS FAECALIS (A)  Final   Report Status 09/16/2022 FINAL  Final   Organism ID, Bacteria ENTEROCOCCUS FAECALIS (A)  Final      Susceptibility   Enterococcus faecalis - MIC*    AMPICILLIN <=2 SENSITIVE Sensitive     NITROFURANTOIN <=16 SENSITIVE Sensitive     VANCOMYCIN 1 SENSITIVE Sensitive     * 20,000 COLONIES/mL ENTEROCOCCUS FAECALIS  Resp panel by RT-PCR (RSV, Flu A&B, Covid) Anterior Nasal Swab     Status: None   Collection Time: 09/15/22  2:05 PM   Specimen: Anterior Nasal Swab  Result Value Ref Range Status   SARS Coronavirus 2 by RT PCR NEGATIVE NEGATIVE Final    Comment: (NOTE) SARS-CoV-2 target nucleic acids are NOT DETECTED.  The SARS-CoV-2 RNA is generally detectable in upper respiratory specimens during the acute phase of infection. The lowest concentration of SARS-CoV-2 viral copies this assay can detect is 138 copies/mL. A negative result does not preclude SARS-Cov-2 infection and should not be used as the sole basis for treatment or other patient management decisions. A negative result may occur with  improper specimen collection/handling, submission of specimen other than nasopharyngeal swab, presence of viral mutation(s) within the areas targeted by this assay, and inadequate number of viral copies(<138 copies/mL). A negative result must be combined with clinical observations, patient history, and epidemiological information. The expected result is Negative.  Fact Sheet  for Patients:  EntrepreneurPulse.com.au  Fact Sheet for Healthcare Providers:  IncredibleEmployment.be  This test is no t yet approved or cleared by the [place] FDA and  has been authorized for detection and/or diagnosis of SARS-CoV-2 by FDA under an Emergency Use Authorization (EUA). This EUA will  remain  in effect (meaning this test can be used) for the duration of the COVID-19 declaration under Section 564(b)(1) of the Act, 21 U.S.C.section 360bbb-3(b)(1), unless the authorization is terminated  or revoked sooner.       Influenza A by PCR NEGATIVE NEGATIVE Final   Influenza B by PCR NEGATIVE NEGATIVE Final    Comment: (NOTE) The Xpert Xpress SARS-CoV-2/FLU/RSV plus assay is intended as an aid in the diagnosis of influenza from Nasopharyngeal swab specimens and should not be used as a sole basis for treatment. Nasal washings and aspirates are unacceptable for Xpert Xpress SARS-CoV-2/FLU/RSV testing.  Fact Sheet for Patients: EntrepreneurPulse.com.au  Fact Sheet for Healthcare Providers: IncredibleEmployment.be  This test is not yet approved or cleared by the [place] FDA and has been authorized for detection and/or diagnosis of SARS-CoV-2 by FDA under an Emergency Use Authorization (EUA). This EUA will remain in effect (meaning this test can be used) for the duration of the COVID-19 declaration under Section 564(b)(1) of the Act, 21 U.S.C. section 360bbb-3(b)(1), unless the authorization is terminated or revoked.     Resp Syncytial Virus by PCR NEGATIVE NEGATIVE Final    Comment: (NOTE) Fact Sheet for Patients: EntrepreneurPulse.com.au  Fact Sheet for Healthcare Providers: IncredibleEmployment.be  This test is not yet approved or cleared by the [place] FDA and has been authorized for detection and/or diagnosis of SARS-CoV-2 by FDA under an Emergency Use Authorization (EUA). This EUA will remain in effect (meaning this test can be used) for the duration of the COVID-19 declaration under Section 564(b)(1) of the Act, 21 U.S.C. section 360bbb-3(b)(1), unless the authorization is terminated or revoked.  Performed at [facility], [address], [place], [place] [address]   Urine  Culture     Status: None   Collection Time: 09/17/22  7:30 PM   Specimen: Urine, Clean Catch  Result Value Ref Range Status   Specimen Description   Final    URINE, CLEAN CATCH Performed at [facility], [address] [place], [place] [address]    Special Requests   Final    NONE Performed at [facility], [address], [place], [place] [address]    Culture   Final    NO GROWTH Performed at [facility], [place] [address], [place], [place] [address]    Report Status 09/19/2022 FINAL  Final    Labs: CBC: Recent Labs  Lab 09/13/22 2220 09/14/22 0520 09/16/22 0532 09/17/22 0341  WBC 14.3* 13.2* 11.3* 10.9*  NEUTROABS 11.6* 10.2*  --   --   HGB 17.0 16.1 14.8 14.4  HCT 52.6* 49.6 47.1 44.9  MCV 93.9 93.2 95.5 93.7  PLT 189 166 152 149   Basic Metabolic Panel: Recent Labs  Lab 09/13/22 2220 09/14/22 0520 09/15/22 0338 09/16/22 0710 09/17/22 0341  NA 135 136 137 135 132*  K 3.6 3.3* 3.9 3.8 3.7  CL 96* 98 98 99 100  CO2 24 28 30 26 24   GLUCOSE 197* 136* 134* 112* 130*  BUN 22 19 17 14 12   CREATININE 1.10 0.86 0.82 0.76 0.70  CALCIUM 9.7 9.6 9.7 8.9 9.0  MG  --  1.5* 2.1 1.7 1.8   Liver  Function Tests: Recent Labs  Lab 09/13/22 2220 09/14/22 0520 09/17/22 1758  AST 33 23 26  ALT 32 30 17  ALKPHOS 69 65 67  BILITOT 0.8 0.7 0.8  PROT 6.6 6.5 6.3*  ALBUMIN 3.3* 3.1* 2.9*   CBG: No results for input(s): "GLUCAP" in the last 168 hours.  Discharge time spent: greater than 30 minutes.  Signed: Catarina Hartshorn, MD [facility] 09/20/2022

## 2022-09-20 NOTE — TOC Transition Note (Signed)
Transition of Care [facility]) - CM/SW Discharge Note   Patient Details  Name: Alexander Johnson MRN: [id number] Date of Birth: 12/28/1945  [facility] CM/SW Contact:  Ihor Gully, LCSW Phone Number: 09/20/2022, 11:46 AM   Clinical Narrative:    D/c clinicals sent to facility via fax [phone]. Daughter notified. Nurse to call report. TOC signing off.    Final next level of care: [place] Barriers to Discharge: No Barriers Identified   Patient Goals and CMS Choice   Choice offered to / list presented to : Adult Children  Discharge Placement                  Patient to be transferred to facility by: [facility] Name of family member notified: daughter, [name] Patient and family notified of of transfer: 09/20/22  Discharge Plan and Services Additional resources added to the After Visit Summary for   In-house Referral: Clinical Social Work                        HH Arranged: Therapist, sports, PT, OT, Social Work [employer] Agency: [place] Date Economy Agency Contacted: 09/15/22 Time [place]: 0786 Representative spoke with at [place]: [place] Determinants of Health ([place]) Interventions SDOH Screenings   Food Insecurity: No Food Insecurity (09/14/2022)  Housing: Low Risk  (09/14/2022)  Transportation Needs: No Transportation Needs (09/14/2022)  Utilities: Not At Risk (09/14/2022)  Tobacco Use: Medium Risk (09/13/2022)     Readmission Risk Interventions     No data to display

## 2022-09-20 NOTE — Care Management Important Message (Signed)
Important Message  Patient Details  Name: Alexander Johnson MRN: [id number] Date of Birth: February 24, 1946   Medicare Important Message Given:  Yes     Tommy Medal 09/20/2022, 11:27 AM

## 2022-09-20 NOTE — Final Progress Note (Signed)
Report called to [facility] at [facility].

## 2022-12-06 DEATH — deceased
